# Patient Record
Sex: Female | Born: 1995 | Race: Black or African American | Hispanic: No | Marital: Single | Smoking: Never smoker
Health system: Southern US, Community
[De-identification: ages and names within clinical notes are randomized; demographics above are authoritative.]

## PROBLEM LIST (undated history)

## (undated) DIAGNOSIS — B9689 Other specified bacterial agents as the cause of diseases classified elsewhere: Secondary | ICD-10-CM

## (undated) DIAGNOSIS — N926 Irregular menstruation, unspecified: Principal | ICD-10-CM

## (undated) DIAGNOSIS — R87610 Atypical squamous cells of undetermined significance on cytologic smear of cervix (ASC-US): Principal | ICD-10-CM

## (undated) DIAGNOSIS — Z309 Encounter for contraceptive management, unspecified: Secondary | ICD-10-CM

## (undated) DIAGNOSIS — N898 Other specified noninflammatory disorders of vagina: Principal | ICD-10-CM

## (undated) DIAGNOSIS — M545 Low back pain, unspecified: Secondary | ICD-10-CM

## (undated) DIAGNOSIS — A749 Chlamydial infection, unspecified: Secondary | ICD-10-CM

## (undated) DIAGNOSIS — N9489 Other specified conditions associated with female genital organs and menstrual cycle: Principal | ICD-10-CM

## (undated) DIAGNOSIS — Z113 Encounter for screening for infections with a predominantly sexual mode of transmission: Secondary | ICD-10-CM

## (undated) DIAGNOSIS — D259 Leiomyoma of uterus, unspecified: Secondary | ICD-10-CM

## (undated) DIAGNOSIS — N760 Acute vaginitis: Secondary | ICD-10-CM

## (undated) DIAGNOSIS — Z8619 Personal history of other infectious and parasitic diseases: Principal | ICD-10-CM

## (undated) DIAGNOSIS — J309 Allergic rhinitis, unspecified: Secondary | ICD-10-CM

## (undated) DIAGNOSIS — R87810 Cervical high risk human papillomavirus (HPV) DNA test positive: Principal | ICD-10-CM

## (undated) DIAGNOSIS — N946 Dysmenorrhea, unspecified: Secondary | ICD-10-CM

## (undated) HISTORY — DX: Personal history of other infectious and parasitic diseases: Z86.19

## (undated) HISTORY — DX: Allergic rhinitis, unspecified: J30.9

## (undated) HISTORY — DX: Other specified bacterial agents as the cause of diseases classified elsewhere: B96.89

## (undated) HISTORY — DX: Acute vaginitis: N76.0

## (undated) HISTORY — DX: Other specified noninflammatory disorders of vagina: N89.8

## (undated) HISTORY — DX: Leiomyoma of uterus, unspecified: D25.9

## (undated) HISTORY — DX: Atypical squamous cells of undetermined significance on cytologic smear of cervix (ASC-US): R87.610

## (undated) HISTORY — DX: Cervical high risk human papillomavirus (HPV) DNA test positive: R87.810

## (undated) HISTORY — DX: Other specified conditions associated with female genital organs and menstrual cycle: N94.89

## (undated) HISTORY — DX: Encounter for contraceptive management, unspecified: Z30.9

## (undated) HISTORY — DX: Encounter for screening for infections with a predominantly sexual mode of transmission: Z11.3

## (undated) HISTORY — DX: Dysmenorrhea, unspecified: N94.6

## (undated) HISTORY — DX: Chlamydial infection, unspecified: A74.9

## (undated) HISTORY — DX: Low back pain, unspecified: M54.50

## (undated) HISTORY — DX: Irregular menstruation, unspecified: N92.6

## (undated) HISTORY — DX: Low back pain: M54.5

---
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
## 2014-06-15 ENCOUNTER — Encounter: Payer: Self-pay | Admitting: *Deleted

## 2014-06-21 ENCOUNTER — Ambulatory Visit (INDEPENDENT_AMBULATORY_CARE_PROVIDER_SITE_OTHER): Payer: Medicaid Other | Admitting: Adult Health

## 2014-06-21 ENCOUNTER — Encounter: Payer: Self-pay | Admitting: Adult Health

## 2014-06-21 VITALS — BP 110/70 | Ht 62.5 in | Wt 106.0 lb

## 2014-06-21 DIAGNOSIS — Z3009 Encounter for other general counseling and advice on contraception: Secondary | ICD-10-CM

## 2014-06-21 DIAGNOSIS — Z30011 Encounter for initial prescription of contraceptive pills: Secondary | ICD-10-CM

## 2014-06-21 DIAGNOSIS — Z309 Encounter for contraceptive management, unspecified: Secondary | ICD-10-CM

## 2014-06-21 DIAGNOSIS — N946 Dysmenorrhea, unspecified: Secondary | ICD-10-CM

## 2014-06-21 HISTORY — DX: Dysmenorrhea, unspecified: N94.6

## 2014-06-21 HISTORY — DX: Encounter for contraceptive management, unspecified: Z30.9

## 2014-06-21 MED ORDER — NORETHIN ACE-ETH ESTRAD-FE 1-20 MG-MCG(24) PO CHEW: 1.0000 | CHEWABLE_TABLET | Freq: Every day | ORAL | Status: DC

## 2014-06-21 NOTE — Patient Instructions (Signed)

## 2014-06-21 NOTE — Progress Notes (Signed)
Subjective:     Patient ID: Terry Wallace, female   DOB: Sep 11, 1996, 18 y.o.   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 She started her period in 5th grade ad it is regular but has cramps, can be bad, then takes aleve.She was thinking about depo. She is new to this practice.  Review of Systems See HPI Reviewed past medical,surgical, social and family history. Reviewed medications and allergies.     Objective:   Physical Exam BP 110/70  Ht 5' 2.5" (1.588 m)  Wt 106 lb (48.081 kg)  BMI 19.07 kg/m2  LMP 05/29/2014   Skin warm and dry. Neck: mid line trachea, normal thyroid. Lungs: clear to ausculation bilaterally. Cardiovascular: regular rate and rhythm. Discussed depo and OCs, side effects and benefits, and she now wants to try the pill first.Discussed taking at same time every day and condom use.  Assessment:     Contraceptive management Dysmenorrhea     Plan:     Rx minastrin 1 daily disp 1 pack x 1 year, start with next period Use condoms Review handout on OC use Call prn questions    Return in 1 year  Pap at 19

## 2014-06-24 ENCOUNTER — Telehealth: Payer: Self-pay | Admitting: Adult Health

## 2014-06-24 NOTE — Telephone Encounter (Signed)
Pt started today will start minastrin sunday

## 2014-08-19 ENCOUNTER — Telehealth: Payer: Self-pay | Admitting: Adult Health

## 2014-08-19 NOTE — Telephone Encounter (Signed)
Spoke with pt. Pt states she is on birth control pills and is having frequent bleeding. Pt started bleeding 08/05/14 and again 08/18/14. Advised would need to be seen. Pt is in college. I advised can be seen close to college or can be seen here. Pt wants to be seen here. Call transferred to front desk for appt. [place]

## 2014-09-17 ENCOUNTER — Ambulatory Visit: Payer: Medicaid Other | Admitting: Adult Health

## 2015-01-06 ENCOUNTER — Ambulatory Visit (INDEPENDENT_AMBULATORY_CARE_PROVIDER_SITE_OTHER): Payer: Medicaid Other | Admitting: Adult Health

## 2015-01-06 ENCOUNTER — Encounter: Payer: Self-pay | Admitting: Adult Health

## 2015-01-06 VITALS — BP 130/80 | Ht 62.5 in | Wt 111.5 lb

## 2015-01-06 DIAGNOSIS — Z3202 Encounter for pregnancy test, result negative: Secondary | ICD-10-CM

## 2015-01-06 DIAGNOSIS — N926 Irregular menstruation, unspecified: Secondary | ICD-10-CM

## 2015-01-06 DIAGNOSIS — N946 Dysmenorrhea, unspecified: Secondary | ICD-10-CM

## 2015-01-06 HISTORY — DX: Irregular menstruation, unspecified: N92.6

## 2015-01-06 LAB — POCT URINE PREGNANCY: Preg Test, Ur: NEGATIVE

## 2015-01-06 MED ORDER — IBUPROFEN 800 MG PO TABS: 800.0000 mg | ORAL_TABLET | Freq: Three times a day (TID) | ORAL | Status: DC | PRN

## 2015-01-06 NOTE — Progress Notes (Signed)
Subjective:     Patient ID: Terry Wallace, female   DOB: 16-Sep-1996, 19 y.o.   MRN: [id number]  HPI Terry Wallace is a 19 year old black female in as work in complaining of irregular bleeding and cramps, has missed some pills.Has had sex with condom.  Review of Systems See HPI Reviewed past medical,surgical, social and family history. Reviewed medications and allergies.     Objective:   Physical Exam BP 130/80 mmHg  Ht 5' 2.5" (1.588 m)  Wt 111 lb 8 oz (50.576 kg)  BMI 20.06 kg/m2  LMP 01/28/2016UPT negative, Skin warm and dry.Pelvic: external genitalia is normal in appearance, vagina:period like blood, cervix:smooth and bulbous, uterus: normal size, shape and contour, non tender, no masses felt, adnexa: no masses or tenderness noted.  GC/CHL obtained.     Assessment:     Irregular bleeding Dysmenorrhea      Plan:    GC/CHL sent Continue Minastrin, take daily at same time   Rx motrin 800 mg #60 1 every 8 hours prn cramps with 1 refill Follow up prn

## 2015-01-06 NOTE — Patient Instructions (Signed)
Try motrin for cramps Continue OCs Follow up prn

## 2015-01-09 LAB — GC/CHLAMYDIA PROBE AMP
Chlamydia trachomatis, NAA: NEGATIVE
Neisseria gonorrhoeae by PCR: NEGATIVE

## 2015-04-12 ENCOUNTER — Other Ambulatory Visit: Payer: Self-pay | Admitting: Adult Health

## 2015-05-02 ENCOUNTER — Other Ambulatory Visit: Payer: Self-pay | Admitting: Adult Health

## 2015-05-05 ENCOUNTER — Telehealth: Payer: Self-pay | Admitting: Adult Health

## 2015-05-05 NOTE — Telephone Encounter (Signed)
Has discharge with odor still at college will call in am for appt 6/6

## 2015-05-16 ENCOUNTER — Ambulatory Visit (INDEPENDENT_AMBULATORY_CARE_PROVIDER_SITE_OTHER): Payer: Medicaid Other | Admitting: Adult Health

## 2015-05-16 ENCOUNTER — Encounter: Payer: Self-pay | Admitting: Adult Health

## 2015-05-16 VITALS — BP 112/60 | HR 92 | Ht 62.5 in | Wt 107.0 lb

## 2015-05-16 DIAGNOSIS — A499 Bacterial infection, unspecified: Secondary | ICD-10-CM | POA: Diagnosis not present

## 2015-05-16 DIAGNOSIS — N898 Other specified noninflammatory disorders of vagina: Secondary | ICD-10-CM | POA: Diagnosis not present

## 2015-05-16 DIAGNOSIS — N760 Acute vaginitis: Secondary | ICD-10-CM | POA: Diagnosis not present

## 2015-05-16 DIAGNOSIS — B9689 Other specified bacterial agents as the cause of diseases classified elsewhere: Secondary | ICD-10-CM

## 2015-05-16 HISTORY — DX: Other specified bacterial agents as the cause of diseases classified elsewhere: B96.89

## 2015-05-16 HISTORY — DX: Other specified noninflammatory disorders of vagina: N89.8

## 2015-05-16 LAB — POCT WET PREP (WET MOUNT)
Clue Cells Wet Prep Whiff POC: POSITIVE
WBC, Wet Prep HPF POC: POSITIVE

## 2015-05-16 MED ORDER — METRONIDAZOLE 500 MG PO TABS: 500.0000 mg | ORAL_TABLET | Freq: Two times a day (BID) | ORAL | Status: DC

## 2015-05-16 NOTE — Patient Instructions (Signed)
Bacterial Vaginosis Bacterial vaginosis is a vaginal infection that occurs when the normal balance of bacteria in the vagina is disrupted. It results from an overgrowth of certain bacteria. This is the most common vaginal infection in women of childbearing age. Treatment is important to prevent complications, especially in pregnant women, as it can cause a premature delivery. CAUSES  Bacterial vaginosis is caused by an increase in harmful bacteria that are normally present in smaller amounts in the vagina. Several different kinds of bacteria can cause bacterial vaginosis. However, the reason that the condition develops is not fully understood. RISK FACTORS Certain activities or behaviors can put you at an increased risk of developing bacterial vaginosis, including:  Having a new sex partner or multiple sex partners.  Douching.  Using an intrauterine device (IUD) for contraception. Women do not get bacterial vaginosis from toilet seats, bedding, swimming pools, or contact with objects around them. SIGNS AND SYMPTOMS  Some women with bacterial vaginosis have no signs or symptoms. Common symptoms include:  Grey vaginal discharge.  A fishlike odor with discharge, especially after sexual intercourse.  Itching or burning of the vagina and vulva.  Burning or pain with urination. DIAGNOSIS  Your health care provider will take a medical history and examine the vagina for signs of bacterial vaginosis. A sample of vaginal fluid may be taken. Your health care provider will look at this sample under a microscope to check for bacteria and abnormal cells. A vaginal pH test may also be done.  TREATMENT  Bacterial vaginosis may be treated with antibiotic medicines. These may be given in the form of a pill or a vaginal cream. A second round of antibiotics may be prescribed if the condition comes back after treatment.  HOME CARE INSTRUCTIONS   Only take over-the-counter or prescription medicines as  directed by your health care provider.  If antibiotic medicine was prescribed, take it as directed. Make sure you finish it even if you start to feel better.  Do not have sex until treatment is completed.  Tell all sexual partners that you have a vaginal infection. They should see their health care provider and be treated if they have problems, such as a mild rash or itching.  Practice safe sex by using condoms and only having one sex partner. SEEK MEDICAL CARE IF:   Your symptoms are not improving after 3 days of treatment.  You have increased discharge or pain.  You have a fever. MAKE SURE YOU:   Understand these instructions.  Will watch your condition.  Will get help right away if you are not doing well or get worse. FOR MORE INFORMATION  Centers for Disease Control and Prevention, Division of STD Prevention: AppraiserFraud.fi American Sexual Health Association (ASHA): www.ashastd.org  Document Released: 11/26/2005 Document Revised: 09/16/2013 Document Reviewed: 07/08/2013 [facility] Patient Information 2015 [place], [place]. This information is not intended to replace advice given to you by your health care provider. Make sure you discuss any questions you have with your health care provider. No sex or alcohol with treatment Follow up prn  use condoms

## 2015-05-16 NOTE — Progress Notes (Signed)
Subjective:     Patient ID: Terry Wallace, female   DOB: 1996/11/01, 19 y.o.   MRN: [id number]  HPI Terry Wallace is a 19 year old black female in complaining of vaginal discharge with odor, since February, has no pain or itching or burning.Has not had sex lately, boyfriend in [place].She missed 2 pills and started period, but usually takes on time.She just returned from college in [place] is taking dance.  Review of Systems Patient denies any headaches, hearing loss, fatigue, blurred vision, shortness of breath, chest pain, abdominal pain, problems with bowel movements, urination, or intercourse. No joint pain or mood swings.+vaginal discharge with odor.  Reviewed past medical,surgical, social and family history. Reviewed medications and allergies.     Objective:   Physical Exam BP 112/60 mmHg  Pulse 92  Ht 5' 2.5" (1.588 m)  Wt 107 lb (48.535 kg)  BMI 19.25 kg/m2  LMP 05/12/2015 Skin warm and dry.Pelvic: external genitalia is normal in appearance no lesions, vagina: white discharge with odor,urethra has no lesions or masses noted, cervix:smooth and tiny, uterus: normal size, shape and contour, non tender, no masses felt, adnexa: no masses or tenderness noted. Bladder is non tender and no masses felt. Wet prep: + for clue cells and +WBCs.+whiff.Declines GC/CHL testing, was negative in January.    Assessment:     Vaginal discharge BV     Plan:     Rx flagyl 500 mg 1 bid x 7 days,#14 with 1 refill, no alcohol, review handout on BV   Use condoms Follow up prn

## 2015-05-19 ENCOUNTER — Telehealth: Payer: Self-pay | Admitting: Adult Health

## 2015-05-19 MED ORDER — METRONIDAZOLE 0.75 % VA GEL: 1.0000 | Freq: Every day | VAGINAL | Status: DC

## 2015-05-19 NOTE — Telephone Encounter (Signed)
Had nausea and vomiting with flagyl will stop and rx metrogel

## 2015-10-31 ENCOUNTER — Telehealth: Payer: Self-pay | Admitting: *Deleted

## 2015-10-31 NOTE — Telephone Encounter (Signed)
Pt c/o heavy vaginal bleeding x 2 weeks. Pt takes Minastrin for birth control. Pt requesting med to stop bleeding.

## 2015-10-31 NOTE — Telephone Encounter (Signed)
She stopped by office to make appt

## 2015-11-02 ENCOUNTER — Ambulatory Visit: Payer: Medicaid Other | Admitting: Adult Health

## 2015-11-08 ENCOUNTER — Other Ambulatory Visit: Payer: Self-pay | Admitting: Adult Health

## 2015-12-29 ENCOUNTER — Ambulatory Visit: Payer: Medicaid Other | Admitting: Adult Health

## 2015-12-29 ENCOUNTER — Encounter: Payer: Self-pay | Admitting: Adult Health

## 2016-01-13 ENCOUNTER — Ambulatory Visit (INDEPENDENT_AMBULATORY_CARE_PROVIDER_SITE_OTHER): Payer: Medicaid Other | Admitting: Adult Health

## 2016-01-13 ENCOUNTER — Encounter: Payer: Self-pay | Admitting: Adult Health

## 2016-01-13 VITALS — BP 138/80 | HR 84 | Ht 62.5 in | Wt 113.0 lb

## 2016-01-13 DIAGNOSIS — N760 Acute vaginitis: Secondary | ICD-10-CM

## 2016-01-13 DIAGNOSIS — N898 Other specified noninflammatory disorders of vagina: Secondary | ICD-10-CM

## 2016-01-13 DIAGNOSIS — A499 Bacterial infection, unspecified: Secondary | ICD-10-CM | POA: Diagnosis not present

## 2016-01-13 DIAGNOSIS — Z113 Encounter for screening for infections with a predominantly sexual mode of transmission: Secondary | ICD-10-CM

## 2016-01-13 DIAGNOSIS — B9689 Other specified bacterial agents as the cause of diseases classified elsewhere: Secondary | ICD-10-CM

## 2016-01-13 DIAGNOSIS — Z3009 Encounter for other general counseling and advice on contraception: Secondary | ICD-10-CM | POA: Insufficient documentation

## 2016-01-13 HISTORY — DX: Encounter for screening for infections with a predominantly sexual mode of transmission: Z11.3

## 2016-01-13 LAB — POCT WET PREP (WET MOUNT): CLUE CELLS WET PREP WHIFF POC: POSITIVE

## 2016-01-13 MED ORDER — METRONIDAZOLE 0.75 % VA GEL: 1.0000 | Freq: Every day | VAGINAL | Status: DC

## 2016-01-13 NOTE — Progress Notes (Signed)
Subjective:     Patient ID: [name], female   DOB: 02/17/1996, 20 y.o.   MRN: [id number]  HPI Shasha is a 20 year old black female in for STD screening, no complaints.  Review of Systems Patient denies any headaches, hearing loss, fatigue, blurred vision, shortness of breath, chest pain, abdominal pain, problems with bowel movements, urination, or intercourse. No joint pain or mood swings. Reviewed past medical,surgical, social and family history. Reviewed medications and allergies.     Objective:   Physical Exam BP 138/80 mmHg  Pulse 84  Ht 5' 2.5" (1.588 m)  Wt 113 lb (51.256 kg)  BMI 20.33 kg/m2 Skin warm and dry.Pelvic: external genitalia is normal in appearance no lesions, vagina: white discharge with odor,urethra has no lesions or masses noted, cervix:smooth and bulbous, uterus: normal size, shape and contour, non tender, no masses felt, adnexa: no masses or tenderness noted. Bladder is non tender and no masses felt. Wet prep: + for clue cells and +WBCs. Abdomen soft and non tender, no HSM. Encouraged to use condoms.    Assessment:     Vaginal discharge BV STD screening    Plan:    GC/CHL sent on urine Check HIV,RPR and HSV 2 Rx metrogel 1 applicator in vagina has x 5 days Review handout on BV Follow up prn

## 2016-01-13 NOTE — Patient Instructions (Signed)
Bacterial Vaginosis Bacterial vaginosis is a vaginal infection that occurs when the normal balance of bacteria in the vagina is disrupted. It results from an overgrowth of certain bacteria. This is the most common vaginal infection in women of childbearing age. Treatment is important to prevent complications, especially in pregnant women, as it can cause a premature delivery. CAUSES  Bacterial vaginosis is caused by an increase in harmful bacteria that are normally present in smaller amounts in the vagina. Several different kinds of bacteria can cause bacterial vaginosis. However, the reason that the condition develops is not fully understood. RISK FACTORS Certain activities or behaviors can put you at an increased risk of developing bacterial vaginosis, including:  Having a new sex partner or multiple sex partners.  Douching.  Using an intrauterine device (IUD) for contraception. Women do not get bacterial vaginosis from toilet seats, bedding, swimming pools, or contact with objects around them. SIGNS AND SYMPTOMS  Some women with bacterial vaginosis have no signs or symptoms. Common symptoms include:  Grey vaginal discharge.  A fishlike odor with discharge, especially after sexual intercourse.  Itching or burning of the vagina and vulva.  Burning or pain with urination. DIAGNOSIS  Your health care provider will take a medical history and examine the vagina for signs of bacterial vaginosis. A sample of vaginal fluid may be taken. Your health care provider will look at this sample under a microscope to check for bacteria and abnormal cells. A vaginal pH test may also be done.  TREATMENT  Bacterial vaginosis may be treated with antibiotic medicines. These may be given in the form of a pill or a vaginal cream. A second round of antibiotics may be prescribed if the condition comes back after treatment. Because bacterial vaginosis increases your risk for sexually transmitted diseases, getting  treated can help reduce your risk for chlamydia, gonorrhea, HIV, and herpes. HOME CARE INSTRUCTIONS   Only take over-the-counter or prescription medicines as directed by your health care provider.  If antibiotic medicine was prescribed, take it as directed. Make sure you finish it even if you start to feel better.  Tell all sexual partners that you have a vaginal infection. They should see their health care provider and be treated if they have problems, such as a mild rash or itching.  During treatment, it is important that you follow these instructions:  Avoid sexual activity or use condoms correctly.  Do not douche.  Avoid alcohol as directed by your health care provider.  Avoid breastfeeding as directed by your health care provider. SEEK MEDICAL CARE IF:   Your symptoms are not improving after 3 days of treatment.  You have increased discharge or pain.  You have a fever. MAKE SURE YOU:   Understand these instructions.  Will watch your condition.  Will get help right away if you are not doing well or get worse. FOR MORE INFORMATION  Centers for Disease Control and Prevention, Division of STD Prevention: AppraiserFraud.fi American Sexual Health Association (ASHA): www.ashastd.org    This information is not intended to replace advice given to you by your health care provider. Make sure you discuss any questions you have with your health care provider.   Document Released: 11/26/2005 Document Revised: 12/17/2014 Document Reviewed: 07/08/2013 Elsevier Interactive Patient Education 2016 [employer]. Use metrogel No alcohol or sex

## 2016-01-14 LAB — RPR: RPR Ser Ql: NONREACTIVE

## 2016-01-14 LAB — HSV 2 ANTIBODY, IGG: HSV 2 Glycoprotein G Ab, IgG: <0.91 index (ref 0.00–0.90)

## 2016-01-14 LAB — HIV ANTIBODY (ROUTINE TESTING W REFLEX): HIV SCREEN 4TH GENERATION: NONREACTIVE

## 2016-01-17 LAB — GC/CHLAMYDIA PROBE AMP
Chlamydia trachomatis, NAA: POSITIVE — AB
NEISSERIA GONORRHOEAE BY PCR: NEGATIVE

## 2016-01-18 ENCOUNTER — Telehealth: Payer: Self-pay | Admitting: Adult Health

## 2016-01-18 MED ORDER — AZITHROMYCIN 500 MG PO TABS: ORAL_TABLET | ORAL | Status: DC

## 2016-01-18 NOTE — Telephone Encounter (Signed)
Pt aware labs negative but +CHlamydia, will treat with azithromycin 500 mg #2 take 2 po now and tell partners to go to clinic, no sex and POT 02/16/16 and [facility] sent

## 2016-01-19 ENCOUNTER — Telehealth: Payer: Self-pay | Admitting: Adult Health

## 2016-01-19 ENCOUNTER — Telehealth: Payer: Self-pay | Admitting: *Deleted

## 2016-01-19 NOTE — Telephone Encounter (Signed)
Left message x 1. JSY 

## 2016-01-19 NOTE — Telephone Encounter (Signed)
Vomited about 1 hour after azithromycin, should be fine

## 2016-01-20 NOTE — Telephone Encounter (Signed)
Left message x 2. JSY 

## 2016-01-23 NOTE — Telephone Encounter (Signed)
Left message x 3. JSY 

## 2016-01-24 NOTE — Telephone Encounter (Signed)
3 messages left for pt to return call. Never heard from pt. Encounter closed. JSY 

## 2016-01-26 ENCOUNTER — Telehealth: Payer: Self-pay | Admitting: Adult Health

## 2016-01-26 NOTE — Telephone Encounter (Signed)
She calls and wants partner, Guadlupe Spanish, DOB 06/05/[birth year], Rx azoithromycin 500 mg #2 take 2 po now to [facility] in [place]

## 2016-02-01 ENCOUNTER — Telehealth: Payer: Self-pay | Admitting: Adult Health

## 2016-02-01 NOTE — Telephone Encounter (Signed)
Spoke with pt. Pt was + for chlamydia and wanted med for partner. I looked at JAG's note and she advised partners to go to clinic for treatment. Pt voiced understanding. [place]

## 2016-02-16 ENCOUNTER — Ambulatory Visit (INDEPENDENT_AMBULATORY_CARE_PROVIDER_SITE_OTHER): Payer: Medicaid Other | Admitting: Adult Health

## 2016-02-16 ENCOUNTER — Encounter: Payer: Self-pay | Admitting: Adult Health

## 2016-02-16 VITALS — BP 122/70 | HR 86 | Ht 62.5 in | Wt 111.5 lb

## 2016-02-16 DIAGNOSIS — Z8619 Personal history of other infectious and parasitic diseases: Secondary | ICD-10-CM

## 2016-02-16 HISTORY — DX: Personal history of other infectious and parasitic diseases: Z86.19

## 2016-02-16 NOTE — Patient Instructions (Signed)
Use condoms Follow up prn 

## 2016-02-16 NOTE — Progress Notes (Signed)
Subjective:     Patient ID: Terry Wallace, female   DOB: Sep 05, 1996, 20 y.o.   MRN: [id number]  HPI Terry Wallace is a 20 year old black female in for proof of cure or recent + Chlamydia,she has not had sex, but she did vomit about an hour after taking meds.No complaints today.  Review of Systems Patient denies any headaches, hearing loss, fatigue, blurred vision, shortness of breath, chest pain, abdominal pain, problems with bowel movements, urination, or intercourse. No joint pain or mood swings.For proof of cure for recent Chlamydia  Reviewed past medical,surgical, social and family history. Reviewed medications and allergies.     Objective:   Physical Exam BP 122/70 mmHg  Pulse 86  Ht 5' 2.5" (1.588 m)  Wt 111 lb 8 oz (50.576 kg)  BMI 20.06 kg/m2   Skin warm and dry.  Lungs: clear to ausculation bilaterally. Cardiovascular: regular rate and rhythm.Abdomen soft and non tender. Assessment:     History of chlamydia, for proof of cure    Plan:    Use condoms GC/CHL sent on urine Follow up prn

## 2016-02-18 LAB — GC/CHLAMYDIA PROBE AMP
CHLAMYDIA, DNA PROBE: NEGATIVE
Neisseria gonorrhoeae by PCR: NEGATIVE

## 2016-03-06 ENCOUNTER — Other Ambulatory Visit: Payer: Self-pay | Admitting: Adult Health

## 2016-04-20 ENCOUNTER — Telehealth: Payer: Self-pay | Admitting: Obstetrics and Gynecology

## 2016-04-20 NOTE — Telephone Encounter (Signed)
Pt states she missed her OCP x 1, usually doesn't have a period with OCP, now having vaginal bleeding with clots. Pt states stopped taking OCP when she started the vaginal bleeding. Per Derrek Monaco, NP, needs to make an appt for pregnancy test, no sex until after her appt. Pt verbalized understanding.

## 2016-04-23 ENCOUNTER — Ambulatory Visit (INDEPENDENT_AMBULATORY_CARE_PROVIDER_SITE_OTHER): Payer: Medicaid Other | Admitting: Adult Health

## 2016-04-23 ENCOUNTER — Encounter: Payer: Self-pay | Admitting: Adult Health

## 2016-04-23 VITALS — BP 128/78 | HR 82 | Ht 62.5 in | Wt 109.0 lb

## 2016-04-23 DIAGNOSIS — Z3202 Encounter for pregnancy test, result negative: Secondary | ICD-10-CM | POA: Diagnosis not present

## 2016-04-23 DIAGNOSIS — Z30011 Encounter for initial prescription of contraceptive pills: Secondary | ICD-10-CM | POA: Diagnosis not present

## 2016-04-23 DIAGNOSIS — Z3041 Encounter for surveillance of contraceptive pills: Secondary | ICD-10-CM

## 2016-04-23 LAB — POCT URINE PREGNANCY: PREG TEST UR: NEGATIVE

## 2016-04-23 MED ORDER — NORETHIN ACE-ETH ESTRAD-FE 1-20 MG-MCG(24) PO CHEW: CHEWABLE_TABLET | ORAL | Status: DC

## 2016-04-23 NOTE — Progress Notes (Signed)
Subjective:     Patient ID: [name], female   DOB: [birth year], 20 y.o.   MRN: [id number]  HPI Eliannah is a 20 year old black female in for UPT, she missed a pill then just stopped taking them, has not had sex since then.  Review of Systems Patient denies any headaches, hearing loss, fatigue, blurred vision, shortness of breath, chest pain, abdominal pain, problems with bowel movements, urination, or intercourse. No joint pain or mood swings. Reviewed past medical,surgical, social and family history. Reviewed medications and allergies.     Objective:   Physical Exam BP 128/78 mmHg  Pulse 82  Ht 5' 2.5" (1.588 m)  Wt 109 lb (49.442 kg)  BMI 19.61 kg/m2  LMP 04/19/2016 UPT negative, Skin warm and dry, no rashes. Lungs: clear to ausculation bilaterally. Cardiovascular: regular rate and rhythm.   Discussed take missed pill when realizes it, and never stop the pills and use condoms. Assessment:     Contraceptive management     Plan:     Refilled minastrin x 6 Use condoms  Return in 6 months for pap and physical

## 2016-04-23 NOTE — Patient Instructions (Signed)
Start minastrin today Use condoms Return in 6 months for pap and physical

## 2016-07-03 ENCOUNTER — Telehealth: Payer: Self-pay | Admitting: Adult Health

## 2016-07-03 NOTE — Telephone Encounter (Signed)
Pt stopped by the office and wanted to check and see if the BCP that she was given from the pharmacy was the same as what she had been given. I showed the medication to [facility] and she advised that it was the same.

## 2016-07-11 ENCOUNTER — Encounter: Payer: Self-pay | Admitting: Adult Health

## 2016-07-11 ENCOUNTER — Ambulatory Visit (INDEPENDENT_AMBULATORY_CARE_PROVIDER_SITE_OTHER): Payer: Medicaid Other | Admitting: Adult Health

## 2016-07-11 DIAGNOSIS — N9489 Other specified conditions associated with female genital organs and menstrual cycle: Secondary | ICD-10-CM | POA: Diagnosis not present

## 2016-07-11 HISTORY — DX: Other specified conditions associated with female genital organs and menstrual cycle: N94.89

## 2016-07-11 LAB — POCT WET PREP (WET MOUNT): Clue Cells Wet Prep Whiff POC: NEGATIVE

## 2016-07-11 MED ORDER — NYSTATIN-TRIAMCINOLONE 100000-0.1 UNIT/GM-% EX CREA: 1.0000 "application " | TOPICAL_CREAM | Freq: Two times a day (BID) | CUTANEOUS | 0 refills | Status: DC

## 2016-07-11 NOTE — Patient Instructions (Addendum)
Use cream to labia bid Follow up prn

## 2016-07-11 NOTE — Progress Notes (Signed)
Subjective:     Patient ID: Terry Wallace, female   DOB: 04/21/1996, 20 y.o.   MRN: [id number]  HPI Terry Wallace is a 20 year old black female, in complaining of swelling of right labia today, no itching or burning or pain.May have used new soap.  Review of Systems + swelling right labia Reviewed past medical,surgical, social and family history. Reviewed medications and allergies.     Objective:   Physical Exam BP 114/60 (BP Location: Left Arm, Patient Position: Sitting, Cuff Size: Normal)   Pulse (!) 102   Ht 5' 2.5" (1.588 m)   Wt 108 lb 8 oz (49.2 kg)   LMP 06/25/2016 (Approximate)   BMI 19.53 kg/m  Skin warm and dry.Pelvic: external genitalia is normal in appearance no lesions,except right labia is slightly swollen, non tender vagina: scant pink without odor,urethra has no lesions or masses noted, cervix:smooth and, uterus: normal size, shape and contour, non tender, no masses felt, adnexa: no masses or tenderness noted. Bladder is non tender and no masses felt. Wet prep: few +WBCs.     Assessment:     Swelling of labia    Plan:     Rx mycolog cream use bid  Follow up prn

## 2016-07-25 ENCOUNTER — Encounter: Payer: Self-pay | Admitting: Adult Health

## 2016-07-25 ENCOUNTER — Ambulatory Visit (INDEPENDENT_AMBULATORY_CARE_PROVIDER_SITE_OTHER): Payer: Medicaid Other | Admitting: Adult Health

## 2016-07-25 VITALS — BP 114/62 | HR 90 | Ht 62.5 in | Wt 107.5 lb

## 2016-07-25 DIAGNOSIS — Z3041 Encounter for surveillance of contraceptive pills: Secondary | ICD-10-CM | POA: Diagnosis not present

## 2016-07-25 DIAGNOSIS — Z3202 Encounter for pregnancy test, result negative: Secondary | ICD-10-CM

## 2016-07-25 DIAGNOSIS — N926 Irregular menstruation, unspecified: Secondary | ICD-10-CM

## 2016-07-25 LAB — POCT URINE PREGNANCY: Preg Test, Ur: NEGATIVE

## 2016-07-25 MED ORDER — NORETHIN ACE-ETH ESTRAD-FE 1-20 MG-MCG(24) PO CAPS: 1.0000 | ORAL_CAPSULE | Freq: Every day | ORAL | 3 refills | Status: DC

## 2016-07-25 NOTE — Progress Notes (Signed)
Subjective:     Patient ID: Terry Wallace, female   DOB: June 07, 1996, 20 y.o.   MRN: [id number]  HPI Terry Wallace is a 20 year old black female in complaining of bleeding on and off x 1 month, has missed a pill and was given generic this time for her OCs.   Review of Systems +bleeding for a month on and off after missed pill,no pain Reviewed past medical,surgical, social and family history. Reviewed medications and allergies.     Objective:   Physical Exam BP 114/62 (BP Location: Left Arm, Patient Position: Sitting, Cuff Size: Normal)   Pulse 90   Ht 5' 2.5" (1.588 m)   Wt 107 lb 8 oz (48.8 kg)   LMP 06/25/2016 (Approximate) Comment: ongoing bleeding  BMI 19.35 kg/m  UPT negative, Skin warm and dry.Pelvic: external genitalia is normal in appearance no lesions, vagina: period like blood,urethra has no lesions or masses noted, cervix:smooth and tiny, uterus: normal size, shape and contour, non tender, no masses felt, adnexa: no masses or tenderness noted. Bladder is non tender and no masses felt.  GC/CHL obtained.     Assessment:     Irregular bleeding - Plan: GC/Chlamydia Probe Amp  Pregnancy examination or test, negative result - Plan: POCT urine pregnancy  Encounter for surveillance of contraceptive pills     Plan:    GC/CHL sent  Finish current pill pack then start taytulla Use condoms Rx taytulla disp #84 take 1 daily with 3 refills  Follow up as scheduled

## 2016-07-25 NOTE — Patient Instructions (Signed)
Finish this pack then start taytulla Use condoms Follow up as scheduled

## 2016-07-27 ENCOUNTER — Telehealth: Payer: Self-pay | Admitting: Adult Health

## 2016-07-27 LAB — GC/CHLAMYDIA PROBE AMP
CHLAMYDIA, DNA PROBE: POSITIVE — AB
NEISSERIA GONORRHOEAE BY PCR: NEGATIVE

## 2016-07-27 MED ORDER — AZITHROMYCIN 500 MG PO TABS: ORAL_TABLET | ORAL | 0 refills | Status: DC

## 2016-07-27 NOTE — Telephone Encounter (Signed)
Left message to call about cultures

## 2016-07-27 NOTE — Telephone Encounter (Signed)
Pt aware +Chlamydia, will rx azithromycin 500 mg #2 2 po now and No sex for 4 weeks, return 9/15 at 9 am for POT and [place] sent,she says she will tell partner to go to clinic

## 2016-08-15 ENCOUNTER — Encounter: Payer: Self-pay | Admitting: Adult Health

## 2016-08-15 ENCOUNTER — Ambulatory Visit (INDEPENDENT_AMBULATORY_CARE_PROVIDER_SITE_OTHER): Payer: Medicaid Other | Admitting: Adult Health

## 2016-08-15 VITALS — BP 108/70 | HR 90 | Ht 62.5 in | Wt 107.0 lb

## 2016-08-15 DIAGNOSIS — Z8619 Personal history of other infectious and parasitic diseases: Secondary | ICD-10-CM | POA: Diagnosis not present

## 2016-08-15 DIAGNOSIS — N926 Irregular menstruation, unspecified: Secondary | ICD-10-CM | POA: Diagnosis not present

## 2016-08-15 MED ORDER — NORETHIN-ETH ESTRAD-FE BIPHAS 1 MG-10 MCG / 10 MCG PO TABS: 1.0000 | ORAL_TABLET | Freq: Every day | ORAL | 0 refills | Status: DC

## 2016-08-15 NOTE — Patient Instructions (Signed)
Finish current OCs then start lo loestrin use condoms Follow up in 2 months

## 2016-08-15 NOTE — Progress Notes (Signed)
Subjective:     Patient ID: Terry Wallace, female   DOB: Oct 18, 1996, 20 y.o.   MRN: [id number]  HPI [name] is a 20 year old black female in complaining of still bleeding some and cramps, she was recently treated for + chlamydia and has not had sex since.  Review of Systems +irregular bleeding and cramps at times Reviewed past medical,surgical, social and family history. Reviewed medications and allergies.     Objective:   Physical Exam BP 108/70 (BP Location: Left Arm, Patient Position: Sitting, Cuff Size: Normal)   Pulse 90   Ht 5' 2.5" (1.588 m)   Wt 107 lb (48.5 kg)   LMP 06/25/2016 (Approximate) Comment: ongoing bleeding  BMI 19.26 kg/m  Skin warm and dry.Pelvic: external genitalia is normal in appearance no lesions, vagina: lite period like blood,urethra has no lesions or masses noted, cervix:smooth, uterus: normal size, shape and contour, non tender, no masses felt, adnexa: no masses or tenderness noted. Bladder is non tender and no masses felt.  GC/CHL obtained.     Assessment:     Irregular bleeding - Plan: GC/Chlamydia Probe Amp  History of chlamydia - Plan: GC/Chlamydia Probe Amp     Plan:     GC/CHL sent Given 2 packs of lo loestrin to try, start after finishing this pack of mibelas, use condoms (lot [id number],exp 3/19) Follow up in 2 months

## 2016-08-17 ENCOUNTER — Telehealth: Payer: Self-pay | Admitting: Adult Health

## 2016-08-17 LAB — GC/CHLAMYDIA PROBE AMP
CHLAMYDIA, DNA PROBE: NEGATIVE
Neisseria gonorrhoeae by PCR: NEGATIVE

## 2016-08-17 NOTE — Telephone Encounter (Signed)
Pt aware CG/CHL negative do not have to come next Friday

## 2016-08-24 ENCOUNTER — Ambulatory Visit: Payer: Medicaid Other | Admitting: Adult Health

## 2016-10-01 ENCOUNTER — Telehealth: Payer: Self-pay | Admitting: *Deleted

## 2016-10-01 NOTE — Telephone Encounter (Signed)
Spoke with pt letting her know she should have prescription for [name] at [facility]. Pt to call pharmacy and make sure. Will call us back if any problems. Pt voiced understanding. [place]

## 2016-10-08 ENCOUNTER — Telehealth: Payer: Self-pay | Admitting: Adult Health

## 2016-10-08 MED ORDER — NORETHIN ACE-ETH ESTRAD-FE 1-20 MG-MCG(24) PO CHEW: CHEWABLE_TABLET | ORAL | 12 refills | Status: DC

## 2016-10-08 NOTE — Telephone Encounter (Signed)
Pt called stating that [facility] sent a prescription of [name] to [facility]. The pharmacy state that they do not carry that medication. Pt would like to know what BC can she take that is equivalent to that [facility]. Please contact pt

## 2016-10-08 NOTE — Telephone Encounter (Signed)
Left message that there is prescription waiting for her

## 2016-10-09 ENCOUNTER — Telehealth: Payer: Self-pay | Admitting: Adult Health

## 2016-10-09 NOTE — Telephone Encounter (Signed)
Spoke with pt. Pt thinks she has been on the [place] before and that caused bleeding. Please advise. Thanks! [place]

## 2016-10-10 ENCOUNTER — Encounter: Payer: Self-pay | Admitting: Adult Health

## 2016-10-10 ENCOUNTER — Other Ambulatory Visit (HOSPITAL_COMMUNITY)
Admission: RE | Admit: 2016-10-10 | Discharge: 2016-10-10 | Disposition: A | Discharge status: Home / Self Care | Payer: Medicaid Other | Source: Ambulatory Visit | Attending: Adult Health | Admitting: Adult Health

## 2016-10-10 ENCOUNTER — Ambulatory Visit (INDEPENDENT_AMBULATORY_CARE_PROVIDER_SITE_OTHER): Payer: Medicaid Other | Admitting: Adult Health

## 2016-10-10 VITALS — BP 102/62 | HR 86 | Ht 62.5 in | Wt 108.0 lb

## 2016-10-10 DIAGNOSIS — Z113 Encounter for screening for infections with a predominantly sexual mode of transmission: Secondary | ICD-10-CM | POA: Insufficient documentation

## 2016-10-10 DIAGNOSIS — Z3041 Encounter for surveillance of contraceptive pills: Secondary | ICD-10-CM

## 2016-10-10 DIAGNOSIS — Z01419 Encounter for gynecological examination (general) (routine) without abnormal findings: Secondary | ICD-10-CM

## 2016-10-10 DIAGNOSIS — Z3009 Encounter for other general counseling and advice on contraception: Secondary | ICD-10-CM

## 2016-10-10 DIAGNOSIS — N898 Other specified noninflammatory disorders of vagina: Secondary | ICD-10-CM

## 2016-10-10 DIAGNOSIS — Z1151 Encounter for screening for human papillomavirus (HPV): Secondary | ICD-10-CM | POA: Insufficient documentation

## 2016-10-10 DIAGNOSIS — Z01411 Encounter for gynecological examination (general) (routine) with abnormal findings: Secondary | ICD-10-CM | POA: Insufficient documentation

## 2016-10-10 DIAGNOSIS — Z308 Encounter for other contraceptive management: Secondary | ICD-10-CM

## 2016-10-10 LAB — POCT WET PREP (WET MOUNT)
CLUE CELLS WET PREP WHIFF POC: NEGATIVE
WBC, Wet Prep HPF POC: POSITIVE

## 2016-10-10 MED ORDER — NORETHIN ACE-ETH ESTRAD-FE 1-20 MG-MCG(24) PO CAPS: 1.0000 | ORAL_CAPSULE | Freq: Every day | ORAL | 3 refills | Status: DC

## 2016-10-10 NOTE — Patient Instructions (Signed)
Use condoms Physical in 1 year

## 2016-10-10 NOTE — Telephone Encounter (Signed)
I gave pt 3 sample boxes of [name]. Lot # [id number] exp 09/2017. [place]

## 2016-10-10 NOTE — Telephone Encounter (Signed)
Left message x 1. JSY 

## 2016-10-10 NOTE — Progress Notes (Signed)
Patient ID: KARIM HAUFLER, female   DOB: 1996/08/07, 20 y.o.   MRN: [id number] History of Present Illness:  Briselda is a 20 year old black female in for well woman gyn exam and pap, has family planning medicaid, and she was given generic for taytulla and she bleeds, she wants name brand.   Current Medications, Allergies, Past Medical History, Past Surgical History, Family History and Social History were reviewed in [employer] record.     Review of Systems: Patient denies any headaches, hearing loss, fatigue, blurred vision, shortness of breath, chest pain, abdominal pain, problems with bowel movements, urination, or intercourse. No joint pain or mood swings.+vaginal odor and discharge    Physical Exam:BP 102/62 (BP Location: Left Arm, Patient Position: Sitting, Cuff Size: Normal)   Pulse 86   Ht 5' 2.5" (1.588 m)   Wt 108 lb (49 kg)   BMI 19.44 kg/m  General:  Well developed, well nourished, no acute distress Skin:  Warm and dry Neck:  Midline trachea, normal thyroid, good ROM, no lymphadenopathy Lungs; Clear to auscultation bilaterally Breast:  No dominant palpable mass, retraction, or nipple discharge Cardiovascular: Regular rate and rhythm Abdomen:  Soft, non tender, no hepatosplenomegaly Pelvic:  External genitalia is normal in appearance, no lesions.  The vagina is normal in appearance,with period like blood. Urethra has no lesions or masses. The cervix is smooth,pa with GC/CHL performed.  Uterus is felt to be normal size, shape, and contour.  No adnexal masses or tenderness noted.Bladder is non tender, no masses felt. Wet prep: +WBC and RBCs Extremities/musculoskeletal:  No swelling or varicosities noted, no clubbing or cyanosis Psych:  No mood changes, alert and cooperative,seems happy PHQ 2 score 0  Impression: 1. Encounter for routine gynecological examination with Papanicolaou smear of cervix   2. Family planning   3. Encounter for surveillance of  contraceptive pills   4. Vaginal discharge   5.      Vaginal Odor    Plan: Meds ordered this encounter  Medications  . DISCONTD: Norethin Ace-Eth Estrad-FE ([name] PO)    Sig: Take by mouth daily.  . Norethin Ace-Eth Estrad-FE ([name]) 1-20 MG-MCG(24) CAPS    Sig: Take 1 tablet by mouth daily.    Dispense:  84 capsule    Refill:  3    Order Specific Question:   Supervising Provider    Answer:   Tania Ade H [[id number]]  Check HIV and RPR Use condoms Physical in 1 year

## 2016-10-12 LAB — HIV ANTIBODY (ROUTINE TESTING W REFLEX): HIV Screen 4th Generation wRfx: NONREACTIVE

## 2016-10-12 LAB — RPR: RPR: NONREACTIVE

## 2016-10-15 ENCOUNTER — Ambulatory Visit: Payer: Medicaid Other | Admitting: Adult Health

## 2016-10-15 LAB — CYTOLOGY - PAP
Chlamydia: NEGATIVE
Diagnosis: UNDETERMINED — AB
HPV: DETECTED — AB
Neisseria Gonorrhea: NEGATIVE

## 2016-10-17 ENCOUNTER — Telehealth: Payer: Self-pay | Admitting: Adult Health

## 2016-10-17 ENCOUNTER — Encounter: Payer: Self-pay | Admitting: Adult Health

## 2016-10-17 DIAGNOSIS — R87610 Atypical squamous cells of undetermined significance on cytologic smear of cervix (ASC-US): Secondary | ICD-10-CM

## 2016-10-17 DIAGNOSIS — R87810 Cervical high risk human papillomavirus (HPV) DNA test positive: Principal | ICD-10-CM

## 2016-10-17 HISTORY — DX: Atypical squamous cells of undetermined significance on cytologic smear of cervix (ASC-US): R87.610

## 2016-10-17 NOTE — Telephone Encounter (Signed)
Left message that pap was abnormal with +HPV, GC/CHL was negative,needs colpo appt to look at cervix,make appt with Dr Elonda Husky and call me back, so I know you got my message and I will put on my chart too.

## 2016-10-25 ENCOUNTER — Encounter: Payer: Self-pay | Admitting: Obstetrics & Gynecology

## 2016-10-25 ENCOUNTER — Ambulatory Visit (INDEPENDENT_AMBULATORY_CARE_PROVIDER_SITE_OTHER): Payer: Self-pay | Admitting: Obstetrics & Gynecology

## 2016-10-25 VITALS — BP 110/80 | HR 80 | Ht 62.2 in | Wt 109.0 lb

## 2016-10-25 DIAGNOSIS — Z3202 Encounter for pregnancy test, result negative: Secondary | ICD-10-CM

## 2016-10-25 DIAGNOSIS — R87610 Atypical squamous cells of undetermined significance on cytologic smear of cervix (ASC-US): Secondary | ICD-10-CM

## 2016-10-25 DIAGNOSIS — R87810 Cervical high risk human papillomavirus (HPV) DNA test positive: Secondary | ICD-10-CM

## 2016-10-25 LAB — POCT URINE PREGNANCY: Preg Test, Ur: NEGATIVE

## 2016-10-25 NOTE — Progress Notes (Signed)
Colposcopy Procedure Note:  Colposcopy Procedure Note  Indications: Pap smear 1 months ago showed: ASCUS with POSITIVE high risk HPV. The prior pap showed na.  Prior cervical/vaginal disease: . Prior cervical treatment: .  Smoker:  No. New sexual partner:  No.  : time frame:    History of abnormal Pap: no  Procedure Details  The risks and benefits of the procedure and Written informed consent obtained.  Speculum placed in vagina and excellent visualization of cervix achieved, cervix swabbed x 3 with acetic acid solution.  Findings: Cervix: HPV changes noted at entire [place] . Vaginal inspection: normal without visible lesions. Vulvar colposcopy: vulvar colposcopy not performed.  Specimens: none  Complications: none.  Plan: Will need repeat Pap in 1 year

## 2017-02-21 ENCOUNTER — Encounter: Payer: Self-pay | Admitting: Adult Health

## 2017-02-21 ENCOUNTER — Ambulatory Visit (INDEPENDENT_AMBULATORY_CARE_PROVIDER_SITE_OTHER): Payer: Self-pay | Admitting: Adult Health

## 2017-02-21 VITALS — BP 110/68 | HR 80 | Ht 62.5 in | Wt 112.0 lb

## 2017-02-21 DIAGNOSIS — R3915 Urgency of urination: Secondary | ICD-10-CM

## 2017-02-21 DIAGNOSIS — R350 Frequency of micturition: Secondary | ICD-10-CM

## 2017-02-21 DIAGNOSIS — N898 Other specified noninflammatory disorders of vagina: Secondary | ICD-10-CM

## 2017-02-21 DIAGNOSIS — R319 Hematuria, unspecified: Secondary | ICD-10-CM

## 2017-02-21 LAB — POCT WET PREP (WET MOUNT)
Clue Cells Wet Prep Whiff POC: NEGATIVE
WBC WET PREP: POSITIVE

## 2017-02-21 LAB — POCT URINALYSIS DIPSTICK
Glucose, UA: NEGATIVE
Ketones, UA: NEGATIVE
NITRITE UA: NEGATIVE

## 2017-02-21 MED ORDER — NITROFURANTOIN MONOHYD MACRO 100 MG PO CAPS: 100.0000 mg | ORAL_CAPSULE | Freq: Two times a day (BID) | ORAL | 0 refills | Status: DC

## 2017-02-21 NOTE — Progress Notes (Signed)
Subjective:     Patient ID: Terry Wallace, female   DOB: [birth year], 21 y.o.   MRN: [id number]  HPI [name] is a 21 year old black female in complaining of urinary urgency and frequency for about 2 months now and has vaginal odor, no discharge, or new sex partners. Has some itching, no burning.  Review of Systems Urinary urgency and frequency Vaginal odor, no discharge Some itching,no burning Reviewed past medical,surgical, social and family history. Reviewed medications and allergies.     Objective:   Physical Exam BP 110/68 (BP Location: Right Arm, Patient Position: Sitting, Cuff Size: Normal)   Pulse 80   Ht 5' 2.5" (1.588 m)   Wt 112 lb (50.8 kg)   BMI 20.16 kg/m urine trace blood,1+protein, 1+leuks. Skin warm and dry.Pelvic: external genitalia is normal in appearance no lesions, vagina: white discharge without odor,urethra has no lesions or masses noted, cervix:smooth, uterus: normal size, shape and contour, non tender, no masses felt, adnexa: no masses or tenderness noted. Bladder is non tender and no masses felt. Wet prep: +WBCs.   She declines STD testing.  Assessment:     1. Urinary urgency   2. Urinary frequency   3. Vaginal odor   4. Hematuria, unspecified type       Plan:     UA C&S sent  Meds ordered this encounter  Medications  . nitrofurantoin, macrocrystal-monohydrate, (MACROBID) 100 MG capsule    Sig: Take 1 capsule (100 mg total) by mouth 2 (two) times daily.    Dispense:  14 capsule    Refill:  0    Order Specific Question:   Supervising Provider    Answer:   Florian Buff [[id number]]  Push water Follow up prn

## 2017-02-21 NOTE — Patient Instructions (Signed)
Push water Follow up prn

## 2017-02-22 LAB — URINALYSIS, ROUTINE W REFLEX MICROSCOPIC
Bilirubin, UA: NEGATIVE
Glucose, UA: NEGATIVE
KETONES UA: NEGATIVE
NITRITE UA: NEGATIVE
PH UA: 6.5 (ref 5.0–7.5)
RBC, UA: NEGATIVE
Specific Gravity, UA: 1.026 (ref 1.005–1.030)
Urobilinogen, Ur: 1 mg/dL (ref 0.2–1.0)

## 2017-02-22 LAB — MICROSCOPIC EXAMINATION

## 2017-02-23 LAB — URINE CULTURE

## 2017-02-25 ENCOUNTER — Telehealth: Payer: Self-pay | Admitting: Adult Health

## 2017-02-25 MED ORDER — AMPICILLIN 500 MG PO CAPS
500.0000 mg | ORAL_CAPSULE | Freq: Three times a day (TID) | ORAL | 0 refills | Status: DC
Start: 2017-02-25 — End: 2017-07-04

## 2017-02-25 NOTE — Telephone Encounter (Signed)
leftt message that urine showed group B strept, and that ampicillin sent to [facility]

## 2017-02-25 NOTE — Telephone Encounter (Signed)
Pt aware of urine and has picked up RX to  Treat group B strep

## 2017-02-27 ENCOUNTER — Telehealth: Payer: Self-pay | Admitting: Adult Health

## 2017-02-27 NOTE — Telephone Encounter (Signed)
Pt says she is having some swelling in vagina and it hurts to walk Counseling given: Not Answered  come in tomorrow at 8:30 to be seen

## 2017-02-28 ENCOUNTER — Ambulatory Visit (INDEPENDENT_AMBULATORY_CARE_PROVIDER_SITE_OTHER): Payer: Self-pay | Admitting: Adult Health

## 2017-02-28 ENCOUNTER — Encounter: Payer: Self-pay | Admitting: Adult Health

## 2017-02-28 VITALS — BP 92/60 | HR 88 | Ht 62.5 in | Wt 110.5 lb

## 2017-02-28 DIAGNOSIS — B009 Herpesviral infection, unspecified: Secondary | ICD-10-CM | POA: Insufficient documentation

## 2017-02-28 DIAGNOSIS — N898 Other specified noninflammatory disorders of vagina: Secondary | ICD-10-CM

## 2017-02-28 DIAGNOSIS — R102 Pelvic and perineal pain: Secondary | ICD-10-CM

## 2017-02-28 LAB — POCT WET PREP (WET MOUNT): WBC, Wet Prep HPF POC: POSITIVE

## 2017-02-28 MED ORDER — VALACYCLOVIR HCL 1 G PO TABS: 1000.0000 mg | ORAL_TABLET | Freq: Two times a day (BID) | ORAL | 1 refills | Status: DC

## 2017-02-28 NOTE — Progress Notes (Signed)
Subjective:     Patient ID: Terry Wallace, female   DOB: May 16, 1996, 21 y.o.   MRN: [id number]  HPI Terry Wallace is a 21 year old black female in complaining of vaginal swelling and soreness with discharge for about 2 days, she is taking antibiotic.She says it hurts to walk, no fever.  Review of Systems +vaginal swelling and soreness and discharge for about 2 days now, hurts to walk No fever Reviewed past medical,surgical, social and family history. Reviewed medications and allergies.     Objective:   Physical Exam BP 92/60 (BP Location: Left Arm, Patient Position: Sitting, Cuff Size: Normal)   Pulse 88   Ht 5' 2.5" (1.588 m)   Wt 110 lb 8 oz (50.1 kg)   BMI 19.89 kg/m    Skin warm and dry.Pelvic: external genitalia is normal in appearance, except for several vesicles, on right and left labia, vagina: watery discharge without odor,urethra has no lesions or masses, noted, did not due full pelvic exam, she was too uncomfortable.Wet prep:  +WBCs.HSV culture obtained. Discussed with pt that this looks like herpes and will treat with valtrex and can use warm water in squirt bottle when voiding and for cleansing.  Assessment:     1. Vaginal pain   2. Vaginal discharge   3. Herpes       Plan:     Rx valtrex 1 gm #20 take 1 bid with 1 refill HSV culture sent Review handout on herpes by [employer]  Follow up prn

## 2017-03-03 LAB — HERPES SIMPLEX VIRUS CULTURE

## 2017-07-04 ENCOUNTER — Encounter: Payer: Self-pay | Admitting: Adult Health

## 2017-07-04 ENCOUNTER — Ambulatory Visit (INDEPENDENT_AMBULATORY_CARE_PROVIDER_SITE_OTHER): Payer: Medicaid Other | Admitting: Adult Health

## 2017-07-04 VITALS — BP 110/58 | HR 89 | Ht 62.5 in | Wt 111.0 lb

## 2017-07-04 DIAGNOSIS — B9689 Other specified bacterial agents as the cause of diseases classified elsewhere: Secondary | ICD-10-CM

## 2017-07-04 DIAGNOSIS — N760 Acute vaginitis: Principal | ICD-10-CM

## 2017-07-04 DIAGNOSIS — Z113 Encounter for screening for infections with a predominantly sexual mode of transmission: Secondary | ICD-10-CM

## 2017-07-04 DIAGNOSIS — N898 Other specified noninflammatory disorders of vagina: Secondary | ICD-10-CM | POA: Insufficient documentation

## 2017-07-04 DIAGNOSIS — N920 Excessive and frequent menstruation with regular cycle: Secondary | ICD-10-CM | POA: Insufficient documentation

## 2017-07-04 LAB — POCT WET PREP (WET MOUNT)
CLUE CELLS WET PREP WHIFF POC: POSITIVE
WBC WET PREP: POSITIVE

## 2017-07-04 MED ORDER — METRONIDAZOLE 0.75 % VA GEL: 1.0000 | Freq: Every day | VAGINAL | 0 refills | Status: DC

## 2017-07-04 NOTE — Progress Notes (Signed)
Subjective:     Patient ID: Terry Wallace, female   DOB: 1996/08/20, 21 y.o.   MRN: [id number]  HPI Terry Wallace is a 21 year old black female in complaining of vaginal odor for about a month and spotting for about a week, has little to no discharge that she has noted.   Review of Systems  +vaginal odor for about a month and spotting for about a week Little to no discharge noted  Reviewed past medical,surgical, social and family history. Reviewed medications and allergies.     Objective:   Physical Exam BP (!) 110/58 (BP Location: Left Arm, Patient Position: Sitting, Cuff Size: Normal)   Pulse 89   Ht 5' 2.5" (1.588 m)   Wt 111 lb (50.3 kg)   BMI 19.98 kg/m    Skin warm and dry.Pelvic: external genitalia is normal in appearance no lesions, vagina: pinkish discharge with odor,urethra has no lesions or masses noted, cervix:smooth, uterus: normal size, shape and contour, non tender, no masses felt, adnexa: no masses or tenderness noted. Bladder is non tender and no masses felt. Wet prep: + for clue cells and +WBCs. GC/CHL obtained.  Assessment:     1. BV (bacterial vaginosis)   2. Vaginal odor   3. Spotting   4. Screening examination for STD (sexually transmitted disease)       Plan:    GC/CHL sent  Meds ordered this encounter  Medications  . metroNIDAZOLE (METROGEL VAGINAL) 0.75 % vaginal gel    Sig: Place 1 Applicatorful vaginally at bedtime.    Dispense:  70 g    Refill:  0    Order Specific Question:   Supervising Provider    Answer:   Florian Buff [[id number]]  Use condoms Follow up prn

## 2017-07-06 LAB — GC/CHLAMYDIA PROBE AMP
Chlamydia trachomatis, NAA: NEGATIVE
Neisseria gonorrhoeae by PCR: NEGATIVE

## 2017-07-18 ENCOUNTER — Encounter: Payer: Self-pay | Admitting: Adult Health

## 2017-07-25 ENCOUNTER — Other Ambulatory Visit: Payer: Self-pay | Admitting: Adult Health

## 2017-08-01 ENCOUNTER — Telehealth: Payer: Self-pay | Admitting: *Deleted

## 2017-10-18 ENCOUNTER — Other Ambulatory Visit: Payer: Self-pay | Admitting: Adult Health

## 2017-12-11 ENCOUNTER — Ambulatory Visit: Payer: Self-pay | Admitting: Adult Health

## 2017-12-24 ENCOUNTER — Ambulatory Visit: Payer: Medicaid Other | Admitting: Adult Health

## 2017-12-25 ENCOUNTER — Ambulatory Visit (INDEPENDENT_AMBULATORY_CARE_PROVIDER_SITE_OTHER): Payer: Medicaid Other | Admitting: Adult Health

## 2017-12-25 ENCOUNTER — Other Ambulatory Visit (HOSPITAL_COMMUNITY)
Admission: RE | Admit: 2017-12-25 | Discharge: 2017-12-25 | Disposition: A | Discharge status: Home / Self Care | Payer: Self-pay | Source: Ambulatory Visit | Attending: Adult Health | Admitting: Adult Health

## 2017-12-25 ENCOUNTER — Encounter: Payer: Self-pay | Admitting: Adult Health

## 2017-12-25 VITALS — BP 100/60 | HR 82 | Ht 62.2 in | Wt 112.0 lb

## 2017-12-25 DIAGNOSIS — Z01419 Encounter for gynecological examination (general) (routine) without abnormal findings: Secondary | ICD-10-CM | POA: Insufficient documentation

## 2017-12-25 DIAGNOSIS — Z309 Encounter for contraceptive management, unspecified: Secondary | ICD-10-CM

## 2017-12-25 DIAGNOSIS — F329 Major depressive disorder, single episode, unspecified: Secondary | ICD-10-CM

## 2017-12-25 DIAGNOSIS — Z113 Encounter for screening for infections with a predominantly sexual mode of transmission: Secondary | ICD-10-CM

## 2017-12-25 DIAGNOSIS — Z3009 Encounter for other general counseling and advice on contraception: Secondary | ICD-10-CM

## 2017-12-25 DIAGNOSIS — Z8742 Personal history of other diseases of the female genital tract: Secondary | ICD-10-CM | POA: Insufficient documentation

## 2017-12-25 DIAGNOSIS — F32A Depression, unspecified: Secondary | ICD-10-CM

## 2017-12-25 DIAGNOSIS — Z3041 Encounter for surveillance of contraceptive pills: Secondary | ICD-10-CM

## 2017-12-25 MED ORDER — NORETHIN ACE-ETH ESTRAD-FE 1-20 MG-MCG(24) PO CAPS: 1.0000 | ORAL_CAPSULE | Freq: Every day | ORAL | 4 refills | Status: DC

## 2017-12-25 NOTE — Addendum Note (Signed)
Addended by: Diona Fanti A on: 12/25/2017 09:04 AM   Modules accepted: Orders

## 2017-12-25 NOTE — Progress Notes (Signed)
Patient ID: Terry Wallace, female   DOB: 07/22/1996, 22 y.o.   MRN: [id number] History of Present Illness: Terry Wallace is a 22 year old black female in of well woman gyn exam and pap.She had ASCUS with +HPV 10/10/16, had normal colpo.She is happy with [name]. She has [employer].    Current Medications, Allergies, Past Medical History, Past Surgical History, Family History and Social History were reviewed in [employer] record.     Review of Systems: Patient denies any headaches, hearing loss, fatigue, blurred vision, shortness of breath, chest pain, abdominal pain, problems with bowel movements, urination, or intercourse. No joint pain or mood swings.    Physical Exam:BP 100/60 (BP Location: Left Arm, Patient Position: Sitting, Cuff Size: Small)   Pulse 82   Ht 5' 2.2" (1.58 m)   Wt 112 lb (50.8 kg)   BMI 20.35 kg/m  General:  Well developed, well nourished, no acute distress Skin:  Warm and dry Neck:  Midline trachea, normal thyroid, good ROM, no lymphadenopathy Lungs; Clear to auscultation bilaterally Breast:  No dominant palpable mass, retraction, or nipple discharge Cardiovascular: Regular rate and rhythm Abdomen:  Soft, non tender, no hepatosplenomegaly Pelvic:  External genitalia is normal in appearance, no lesions.  The vagina is normal in appearance. Urethra has no lesions or masses. The cervix is smooth, pap with HPV and GC/CHL performed.  Uterus is felt to be normal size, shape, and contour.  No adnexal masses or tenderness noted.Bladder is non tender, no masses felt. Extremities/musculoskeletal:  No swelling or varicosities noted, no clubbing or cyanosis Psych:  No mood changes, alert and cooperative,seems happy PHQ 9 score 9, denies being depressed or suicidal, declines meds   Impression:  1. Encounter for gynecological examination with Papanicolaou smear of cervix   2. History of abnormal cervical Pap smear   3. Encounter for surveillance  of contraceptive pills   4. Screening examination for STD (sexually transmitted disease)   5. Family planning   6. Depression, unspecified depression type      Plan: Check HIV and RPR Meds ordered this encounter  Medications  . Norethin Ace-Eth Estrad-FE ([name]) 1-20 MG-MCG(24) CAPS    Sig: Take 1 capsule by mouth daily.    Dispense:  84 capsule    Refill:  4    Order Specific Question:   Supervising Provider    Answer:   Florian Buff [[id number]]  Physical in 1 year, pap in 3 if normal

## 2017-12-26 LAB — HIV ANTIBODY (ROUTINE TESTING W REFLEX): HIV SCREEN 4TH GENERATION: NONREACTIVE

## 2017-12-26 LAB — RPR: RPR: NONREACTIVE

## 2017-12-27 LAB — CYTOLOGY - PAP
CHLAMYDIA, DNA PROBE: POSITIVE — AB
Diagnosis: NEGATIVE
NEISSERIA GONORRHEA: NEGATIVE

## 2017-12-30 ENCOUNTER — Telehealth: Payer: Self-pay | Admitting: Adult Health

## 2017-12-30 ENCOUNTER — Encounter: Payer: Self-pay | Admitting: Adult Health

## 2017-12-30 DIAGNOSIS — A749 Chlamydial infection, unspecified: Secondary | ICD-10-CM

## 2017-12-30 HISTORY — DX: Chlamydial infection, unspecified: A74.9

## 2017-12-30 MED ORDER — AZITHROMYCIN 500 MG PO TABS: ORAL_TABLET | ORAL | 0 refills | Status: DC

## 2017-12-30 NOTE — Telephone Encounter (Signed)
Pt aware +chlamydia on pap, will rx azithromycin 500 mg #2 2 po now, partner to clinic, no sex, POC 2/18 at 8:30 am. [facility] sent

## 2018-01-27 ENCOUNTER — Ambulatory Visit (INDEPENDENT_AMBULATORY_CARE_PROVIDER_SITE_OTHER): Payer: Medicaid Other | Admitting: Adult Health

## 2018-01-27 ENCOUNTER — Encounter: Payer: Self-pay | Admitting: Adult Health

## 2018-01-27 VITALS — BP 98/60 | HR 88 | Ht 62.5 in | Wt 112.5 lb

## 2018-01-27 DIAGNOSIS — Z8619 Personal history of other infectious and parasitic diseases: Secondary | ICD-10-CM

## 2018-01-27 DIAGNOSIS — Z113 Encounter for screening for infections with a predominantly sexual mode of transmission: Secondary | ICD-10-CM

## 2018-01-27 NOTE — Progress Notes (Signed)
Subjective:     Patient ID: Terry Wallace, female   DOB: June 21, 1996, 22 y.o.   MRN: [id number]  HPI Terry Wallace is a 22 year old black female in for proof of cure for recent chlamydia.She took her meds and has not had sex,and partner took his meds too. No complaints.   Review of Systems Patient denies any headaches, hearing loss, fatigue, blurred vision, shortness of breath, chest pain, abdominal pain, problems with bowel movements, urination, or intercourse. No joint pain or mood swings. Reviewed past medical,surgical, social and family history. Reviewed medications and allergies.     Objective:   Physical Exam BP 98/60 (BP Location: Left Arm, Patient Position: Sitting, Cuff Size: Normal)   Pulse 88   Ht 5' 2.5" (1.588 m)   Wt 112 lb 8 oz (51 kg)   LMP  (LMP Unknown)   BMI 20.25 kg/m  Skin warm and dry.Pelvic: external genitalia is normal in appearance no lesions, vagina: white discharge without odor,urethra has no lesions or masses noted, cervix:smooth, uterus: normal size, shape and contour, non tender, no masses felt, adnexa: no masses or tenderness noted. Bladder is non tender and no masses felt.  GC/CHL obtained.     Assessment:     1. History of chlamydia   2. Screening examination for STD (sexually transmitted disease)       Plan:     GC/CHL sent Use condoms F/U prn

## 2018-01-27 NOTE — Patient Instructions (Signed)
Use condoms  

## 2018-01-29 LAB — GC/CHLAMYDIA PROBE AMP
Chlamydia trachomatis, NAA: NEGATIVE
Neisseria gonorrhoeae by PCR: NEGATIVE

## 2018-03-21 ENCOUNTER — Telehealth: Payer: Self-pay | Admitting: Adult Health

## 2018-03-21 MED ORDER — NORETHIN ACE-ETH ESTRAD-FE 1-20 MG-MCG(24) PO CAPS: 1.0000 | ORAL_CAPSULE | Freq: Every day | ORAL | 4 refills | Status: DC

## 2018-03-21 NOTE — Telephone Encounter (Signed)
Spoke with pt. Pt needs a refill on her birth control. She don't take the sugar pills and she has run out. Please state that she don't take sugar pills. Thanks!! [place]

## 2018-03-21 NOTE — Telephone Encounter (Signed)
Left message that rx sent to take continuously

## 2018-04-25 ENCOUNTER — Ambulatory Visit: Payer: Medicaid Other | Admitting: Women's Health

## 2018-12-18 ENCOUNTER — Other Ambulatory Visit: Payer: Self-pay | Admitting: Adult Health

## 2019-02-11 NOTE — Telephone Encounter (Signed)
Note sent to provider 

## 2019-02-27 ENCOUNTER — Other Ambulatory Visit: Payer: Self-pay | Admitting: Adult Health

## 2019-06-16 ENCOUNTER — Telehealth: Payer: Self-pay | Admitting: Adult Health

## 2019-06-16 NOTE — Telephone Encounter (Signed)
Pt states that she is having irregular bleeding and requesting to speak with a nurse or to make an appt.

## 2019-06-19 ENCOUNTER — Other Ambulatory Visit: Payer: Self-pay | Admitting: Adult Health

## 2019-06-19 MED ORDER — METRONIDAZOLE 500 MG PO TABS: 500.0000 mg | ORAL_TABLET | Freq: Two times a day (BID) | ORAL | 0 refills | Status: DC

## 2019-06-19 NOTE — Progress Notes (Signed)
rx flagyl  

## 2019-06-25 ENCOUNTER — Other Ambulatory Visit: Payer: Self-pay

## 2019-06-25 DIAGNOSIS — Z20822 Contact with and (suspected) exposure to covid-19: Secondary | ICD-10-CM

## 2019-06-28 LAB — NOVEL CORONAVIRUS, NAA: SARS-CoV-2, NAA: NOT DETECTED

## 2020-04-05 ENCOUNTER — Other Ambulatory Visit: Payer: Self-pay | Admitting: Adult Health

## 2020-05-17 ENCOUNTER — Encounter: Payer: Self-pay | Admitting: Adult Health

## 2020-05-17 ENCOUNTER — Ambulatory Visit (INDEPENDENT_AMBULATORY_CARE_PROVIDER_SITE_OTHER): Payer: Self-pay | Admitting: Adult Health

## 2020-05-17 VITALS — BP 124/86 | HR 102 | Ht 62.5 in | Wt 112.0 lb

## 2020-05-17 DIAGNOSIS — N898 Other specified noninflammatory disorders of vagina: Secondary | ICD-10-CM

## 2020-05-17 DIAGNOSIS — N760 Acute vaginitis: Secondary | ICD-10-CM

## 2020-05-17 DIAGNOSIS — Z01419 Encounter for gynecological examination (general) (routine) without abnormal findings: Secondary | ICD-10-CM | POA: Insufficient documentation

## 2020-05-17 DIAGNOSIS — B9689 Other specified bacterial agents as the cause of diseases classified elsewhere: Secondary | ICD-10-CM

## 2020-05-17 LAB — POCT WET PREP (WET MOUNT)
Clue Cells Wet Prep Whiff POC: POSITIVE
WBC, Wet Prep HPF POC: POSITIVE

## 2020-05-17 MED ORDER — METRONIDAZOLE 500 MG PO TABS: 500.0000 mg | ORAL_TABLET | Freq: Two times a day (BID) | ORAL | 0 refills | Status: DC

## 2020-05-17 NOTE — Progress Notes (Signed)
Patient ID: Terry Wallace, female   DOB: 09-Sep-1996, 24 y.o.   MRN: [id number] History of Present Illness: Terry Wallace is a 24 year old black female, single, G0P0, in for well woman gyn exam.She had normal pap in [place] last year she says. She stopped birth control in December and periods regular, and she is using condoms. She back home and taking massage therapy classes, boyfriend is in [place], she used to Radiation protection practitioner for [employer].  PCP is Dr Karie Kirks.   Current Medications, Allergies, Past Medical History, Past Surgical History, Family History and Social History were reviewed in [employer] record.     Review of Systems: Patient denies any headaches, hearing loss, fatigue, blurred vision, shortness of breath, chest pain, abdominal pain, problems with bowel movements, urination, or intercourse. No joint pain or mood swings. Has noticed vaginal discharge with odor for a few weeks, denies any itching ?hemorrhoid   Physical Exam:BP 124/86 (BP Location: Left Arm, Patient Position: Sitting, Cuff Size: Normal)   Pulse (!) 102   Ht 5' 2.5" (1.588 m)   Wt 112 lb (50.8 kg)   LMP 05/10/2020   BMI 20.16 kg/m  General:  Well developed, well nourished, no acute distress Skin:  Warm and dry Neck:  Midline trachea, normal thyroid, good ROM, no lymphadenopathy Lungs; Clear to auscultation bilaterally Breast:  No dominant palpable mass, retraction, or nipple discharge Cardiovascular: Regular rate and rhythm Abdomen:  Soft, non tender, no hepatosplenomegaly Pelvic:  External genitalia is normal in appearance, no lesions.  The vagina is normal in appearance, has creamy vaginal discharge with odor. Urethra has no lesions or masses. The cervix is nulliparous.  Uterus is felt to be normal size, shape, and contour.  No adnexal masses or tenderness noted.Bladder is non tender, no masses felt. Wet prep:+WBCs and +clues cells, Extremities/musculoskeletal:  No swelling or varicosities noted, no clubbing or  cyanosis Psych:  No mood changes, alert and cooperative,seems happy AA 1 Fall risk is low PHQ 9 score 8, no SI.declines meds Co exam with Terri Skains NP student She declines STD testing.   Impression and Plan: 1. Encounter for well woman exam with routine gynecological exam Physical in 1 year Pap in 2023  2. Vaginal discharge  3. Vaginal odor   4. BV (bacterial vaginosis) Will rx flagyl Meds ordered this encounter  Medications  . metroNIDAZOLE (FLAGYL) 500 MG tablet    Sig: Take 1 tablet (500 mg total) by mouth 2 (two) times daily.    Dispense:  14 tablet    Refill:  0    Order Specific Question:   Supervising Provider    Answer:   Tania Ade H [[id number]]   No sex or alcohol during treatment

## 2021-04-26 DIAGNOSIS — Z01419 Encounter for gynecological examination (general) (routine) without abnormal findings: Secondary | ICD-10-CM | POA: Diagnosis not present

## 2021-04-26 DIAGNOSIS — Z681 Body mass index (BMI) 19 or less, adult: Secondary | ICD-10-CM | POA: Diagnosis not present

## 2021-04-26 DIAGNOSIS — Z113 Encounter for screening for infections with a predominantly sexual mode of transmission: Secondary | ICD-10-CM | POA: Diagnosis not present

## 2021-04-26 LAB — HM PAP SMEAR: HM Pap smear: NEGATIVE

## 2021-05-24 ENCOUNTER — Ambulatory Visit: Payer: Self-pay | Admitting: Nurse Practitioner

## 2021-05-30 DIAGNOSIS — D251 Intramural leiomyoma of uterus: Secondary | ICD-10-CM | POA: Diagnosis not present

## 2021-06-13 ENCOUNTER — Other Ambulatory Visit: Payer: Self-pay | Admitting: Obstetrics & Gynecology

## 2021-06-13 DIAGNOSIS — Q519 Congenital malformation of uterus and cervix, unspecified: Secondary | ICD-10-CM

## 2021-06-25 ENCOUNTER — Ambulatory Visit
Admission: RE | Admit: 2021-06-25 | Discharge: 2021-06-25 | Disposition: A | Discharge status: Home / Self Care | Payer: BC Managed Care – PPO | Source: Ambulatory Visit | Attending: Obstetrics & Gynecology | Admitting: Obstetrics & Gynecology

## 2021-06-25 DIAGNOSIS — Q519 Congenital malformation of uterus and cervix, unspecified: Secondary | ICD-10-CM

## 2021-06-25 DIAGNOSIS — D259 Leiomyoma of uterus, unspecified: Secondary | ICD-10-CM | POA: Diagnosis not present

## 2021-06-25 DIAGNOSIS — Q51810 Arcuate uterus: Secondary | ICD-10-CM | POA: Diagnosis not present

## 2021-07-03 ENCOUNTER — Ambulatory Visit: Payer: Self-pay | Admitting: Family Medicine

## 2021-07-03 DIAGNOSIS — Z20822 Contact with and (suspected) exposure to covid-19: Secondary | ICD-10-CM | POA: Diagnosis not present

## 2021-07-07 DIAGNOSIS — Z20822 Contact with and (suspected) exposure to covid-19: Secondary | ICD-10-CM | POA: Diagnosis not present

## 2021-11-14 IMAGING — MR MR PELVIS W/O CM
5 of 9 series · 23 of 48 positions shown · non-contrast
Comparison: None.

CLINICAL DATA: Congenital malformation of the uterus, uterine
fibroid.

EXAM:
MRI PELVIS WITHOUT CONTRAST
TECHNIQUE: Multiplanar multisequence MR imaging of the pelvis was performed. No
intravenous contrast was administered.

[Series 3: T2 · coronal · 6.0mm · 0.78mm/px · 4 of 27 slices shown]
[im 1/27]
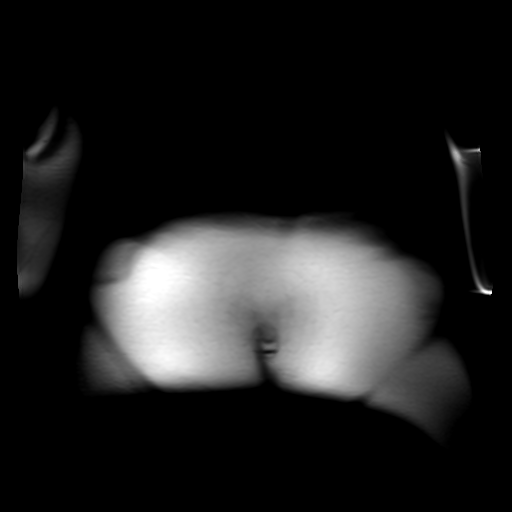
[im 9/27]
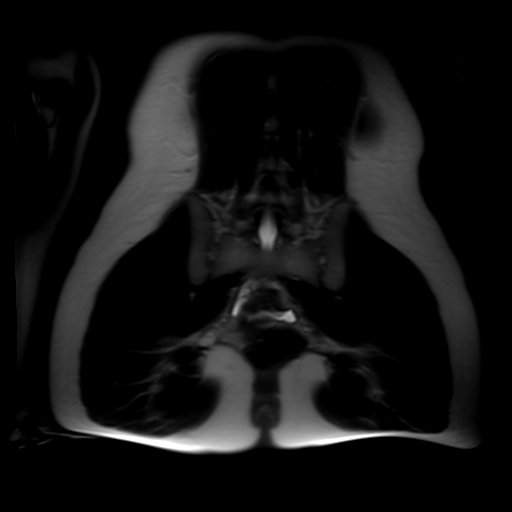
[im 18/27]
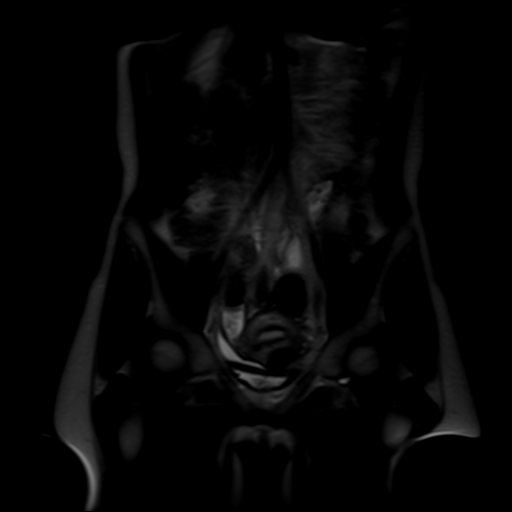
[im 27/27]
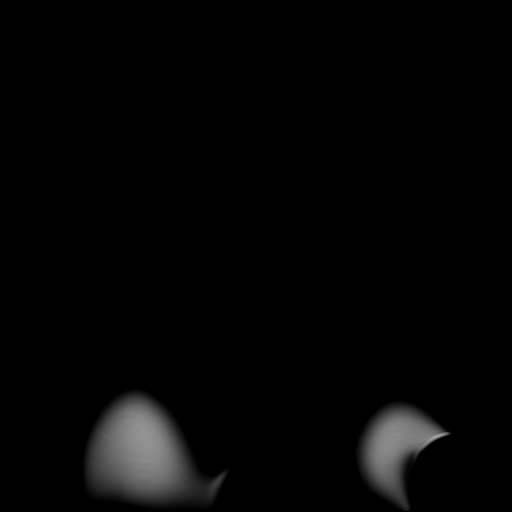

[Series 4: t2_tse axial · axial · 5.0mm · 0.51mm/px · z∈[-90,+73]mm · 5 of 27 slices shown]
[im 1/27]
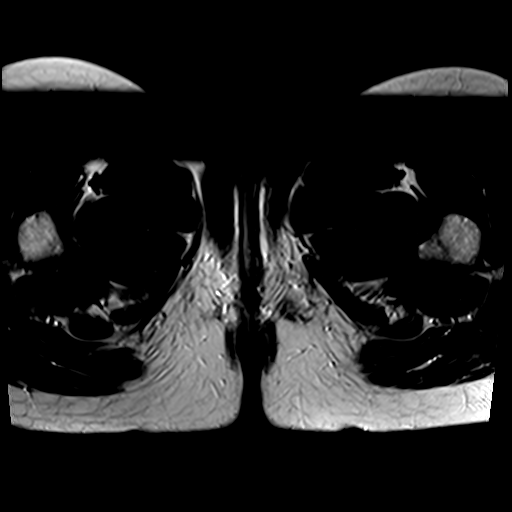
[im 7/27]
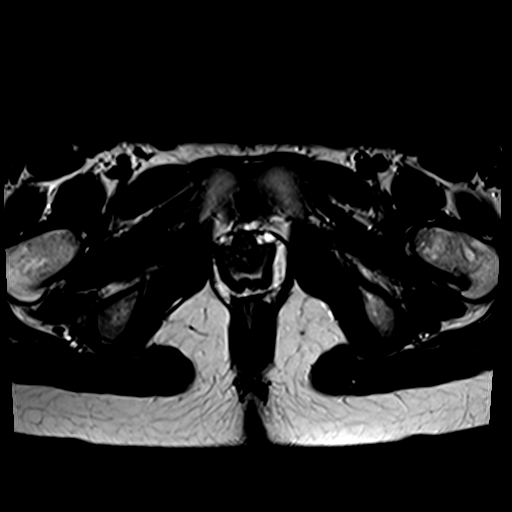
[im 14/27]
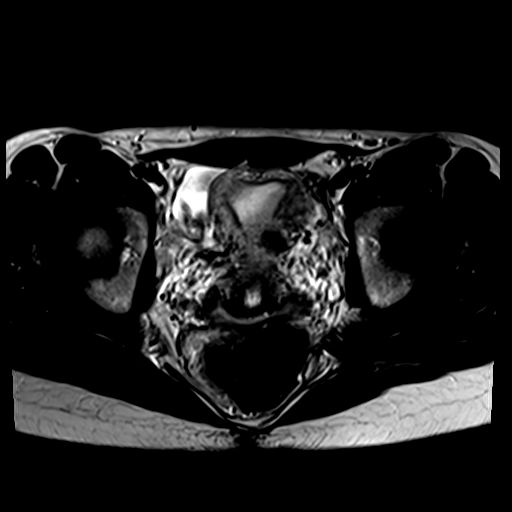
[im 20/27]
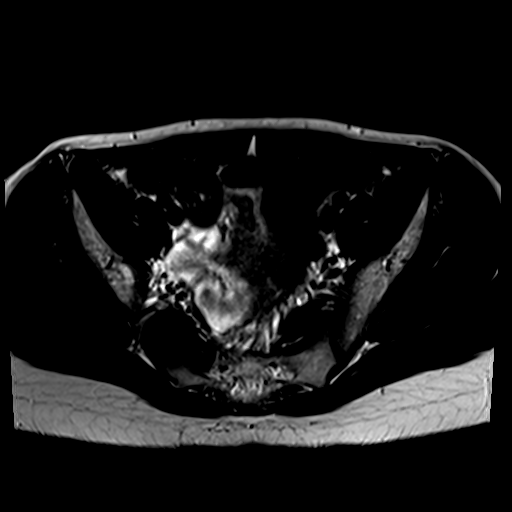
[im 27/27]
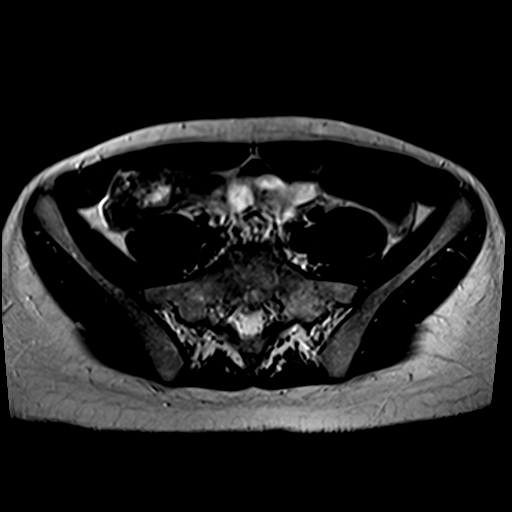

[Series 5: t2_tse_sag · sagittal · 5.0mm · 0.98mm/px · 4 of 25 slices shown]
[im 1/25]
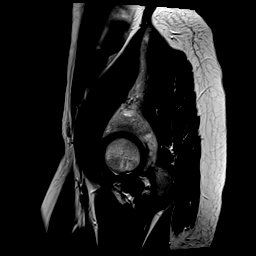
[im 9/25]
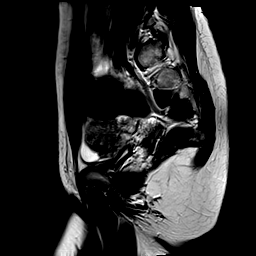
[im 17/25]
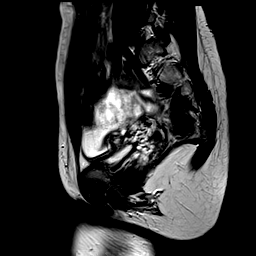
[im 25/25]
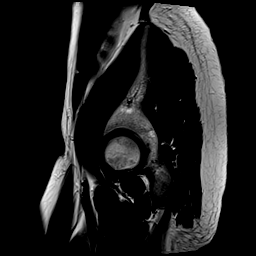

[Series 6: t2_tse axial fs · axial · 5.0mm · 0.51mm/px · z∈[-90,+73]mm · 5 of 27 slices shown]
[im 1/27]
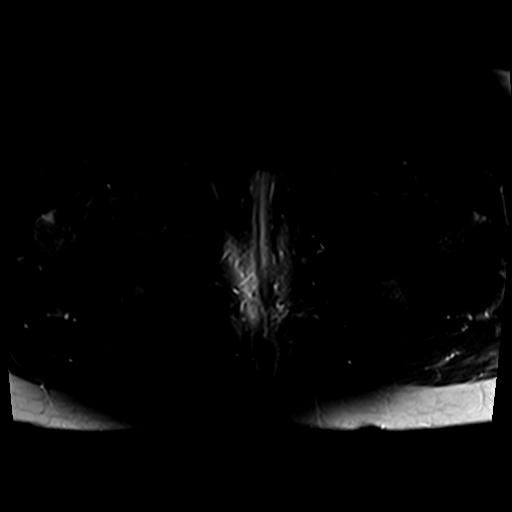
[im 7/27]
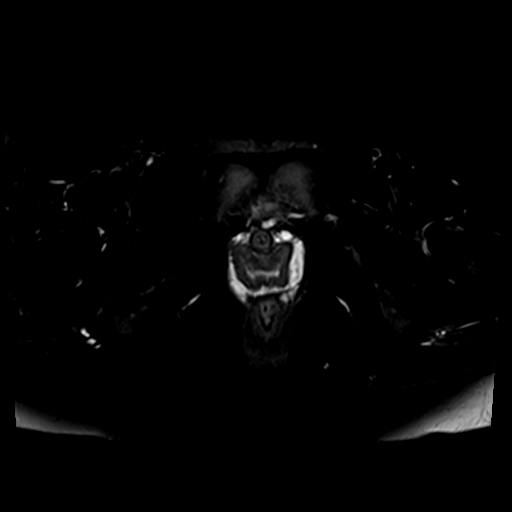
[im 14/27]
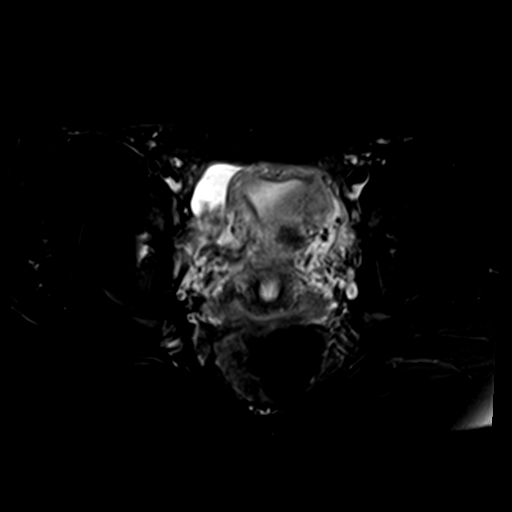
[im 20/27]
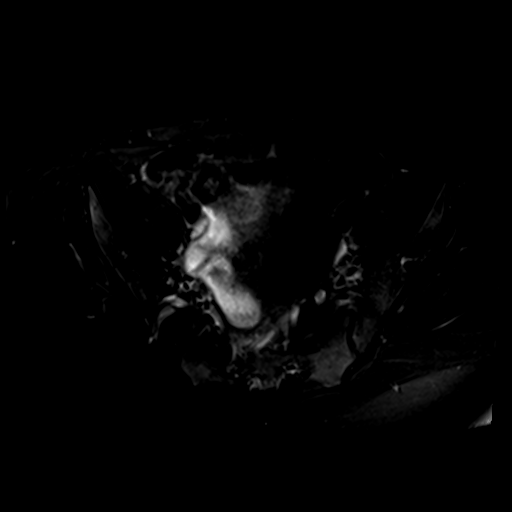
[im 27/27]
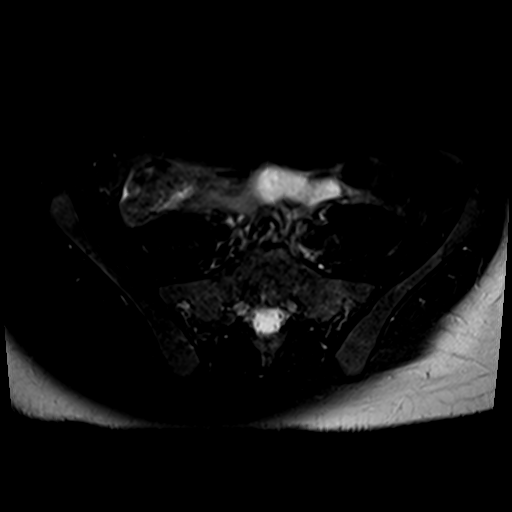

[Series 7: axial in out · axial · 5.5mm · 0.70mm/px · z∈[-100,+7]mm · 5 of 60 slices shown]
[im 1/60]
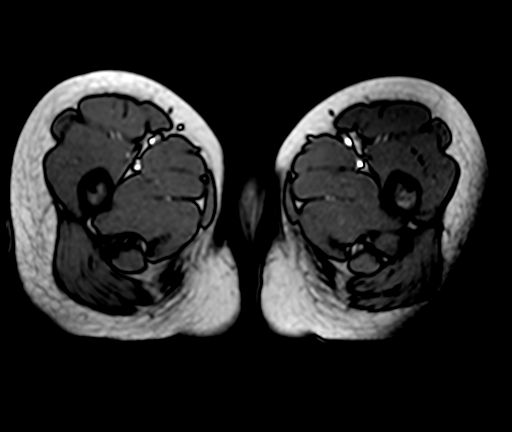
[im 12/60]
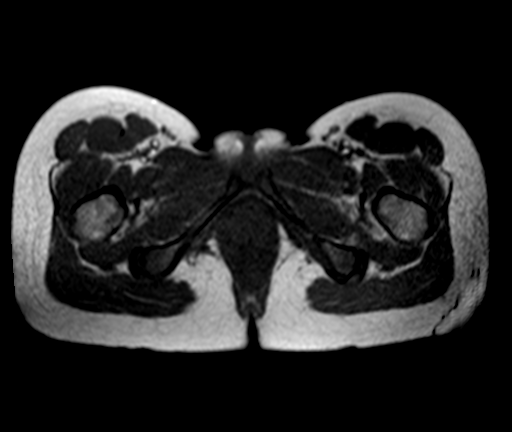
[im 18/60]
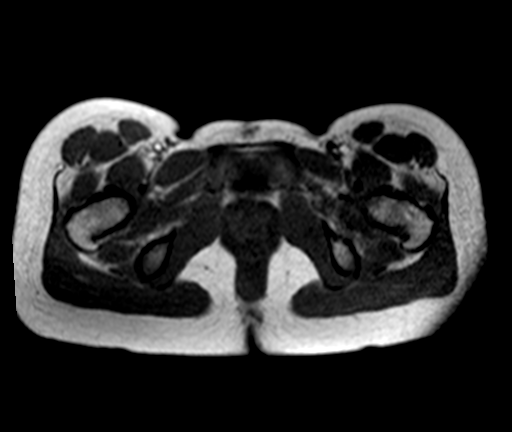
[im 24/60]
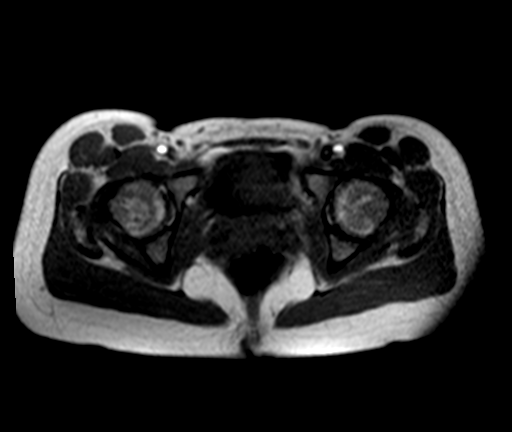
[im 36/60]
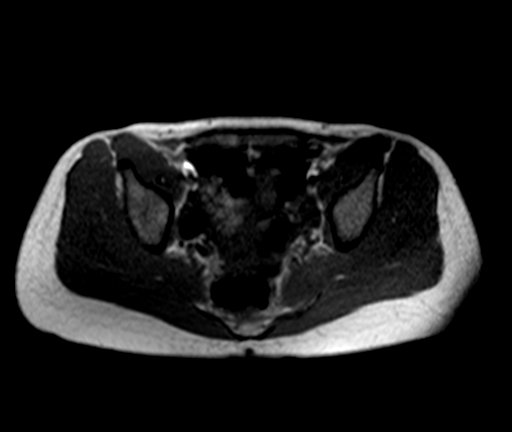

[23 of 48 positions shown; findings below may reference images not displayed]

FINDINGS: Urinary Tract: Unremarkable urinary bladder.

Bowel: Unremarkable pelvic bowel loops.

Vascular/Lymphatic: No pathologically enlarged lymph nodes or other
significant abnormality.

Reproductive:

Uterus: Measures 7.5 x 5.0 x 3.6 cm. Endometrial stripe measures 7
mm.

Uterus is anteverted and anteflexed. There is normal uterine contour
without diversion of uterine horns, with smooth indentation of the
fundal endometrial canal and the depth of indentation approximately
6 mm.

Leiomyomatous uterus, approximately 3, all of which appear to be
intramural the largest of which is in the anterior aspect of the
uterus and measures approximately 1.6 cm in maximum dimension. No
pedunculated or submucosal fibroids visualized.

Right ovary: Simple appearing physiologic 2.1 cm right ovarian cyst,
otherwise the ovary appears unremarkable.

Left ovary:  Appears normal. No mass identified.

Other: No pelvic ascites.

Musculoskeletal:  No suspicious osseous lesion identified.
IMPRESSION: 1. Arcuate uterus with multiple uterine fibroids, as above.

## 2021-12-07 ENCOUNTER — Other Ambulatory Visit: Payer: Self-pay

## 2021-12-07 ENCOUNTER — Encounter: Payer: Self-pay | Admitting: Emergency Medicine

## 2021-12-07 ENCOUNTER — Ambulatory Visit
Admission: EM | Admit: 2021-12-07 | Discharge: 2021-12-07 | Disposition: A | Discharge status: Home / Self Care | Payer: BC Managed Care – PPO | Attending: Physician Assistant | Admitting: Physician Assistant

## 2021-12-07 DIAGNOSIS — M791 Myalgia, unspecified site: Secondary | ICD-10-CM | POA: Diagnosis not present

## 2021-12-07 MED ORDER — CYCLOBENZAPRINE HCL 10 MG PO TABS: 10.0000 mg | ORAL_TABLET | Freq: Two times a day (BID) | ORAL | 0 refills | Status: DC | PRN

## 2021-12-07 MED ORDER — PREDNISONE 20 MG PO TABS: 40.0000 mg | ORAL_TABLET | Freq: Every day | ORAL | 0 refills | Status: AC
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
## 2021-12-08 ENCOUNTER — Other Ambulatory Visit: Payer: Self-pay

## 2021-12-08 ENCOUNTER — Ambulatory Visit
Admission: EM | Admit: 2021-12-08 | Discharge: 2021-12-08 | Disposition: A | Discharge status: Home / Self Care | Payer: BC Managed Care – PPO

## 2021-12-08 DIAGNOSIS — R0789 Other chest pain: Secondary | ICD-10-CM

## 2021-12-08 NOTE — Discharge Instructions (Addendum)
-  Continue the prednisone as prescribed -Tylenol for pain -Heating pad -Symptoms should slowly improve over the next 4-5 days, follow-up if they're getting worse instead of better, or new symptoms like shortness of breath, chest pain at rest, dizziness.
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
## 2022-03-27 NOTE — Progress Notes (Signed)
? ?New Patient Office Visit ? ?Subjective   ? ?Patient ID: Terry Wallace, female    DOB: 09-01-1996  Age: 26 y.o. MRN: [id number] ? ?CC:  ?Chief Complaint  ?Patient presents with  ? Establish Care  ?  Np. Est care. Pt c/o congested left side nostril for several years.   ? ? ?HPI ?Terry Wallace presents for new patient visit to establish care.  Introduced to Designer, [employer]ellery role and practice setting.  All questions answered.  Discussed provider/patient relationship and expectations. ? ?She has been having nasal congestion on and off for the last 3.5 years, mainly on the left nostril. She tried nasal strips, nasal sprays. This all started with a cold or sinus infection. She is not sure what is triggering the ongoing symptoms. She also tried zyrtec and allegra which helped her allergies, but not the nasal congestion. Denies pain, fevers, ear pain/pressure, sore throat.  ? ?Depression and Anxiety Screen done: ? ? ?  03/28/2022  ? 11:18 AM 05/17/2020  ?  1:43 PM 12/25/2017  ?  8:45 AM 10/10/2016  ?  3:18 PM  ?Depression screen PHQ 2/9  ?Decreased Interest 0 0 0 0  ?Down, Depressed, Hopeless 0 1 1 0  ?PHQ - 2 Score 0 1 1 0  ?Altered sleeping  2 3   ?Tired, decreased energy  1 2   ?Change in appetite  1 1   ?Feeling bad or failure about yourself   1 0   ?Trouble concentrating  1 2   ?Moving slowly or fidgety/restless  1 0   ?Suicidal thoughts  0 0   ?PHQ-9 Score  8 9   ?Difficult doing work/chores  Not difficult at all Not difficult at all   ? ? ?  05/17/2020  ?  1:44 PM  ?GAD 7 : Generalized Anxiety Score  ?Nervous, Anxious, on Edge 1  ?Control/stop worrying 1  ?Worry too much - different things 1  ?Trouble relaxing 1  ?Restless 1  ?Easily annoyed or irritable 1  ?Afraid - awful might happen 1  ?Total GAD 7 Score 7  ?Anxiety Difficulty Not difficult at all  ? ? ?Outpatient Encounter Medications as of 03/28/2022  ?Medication Sig  ? [DISCONTINUED] cyclobenzaprine (FLEXERIL) 10 MG tablet Take 1 tablet (10 mg total) by mouth 2 (two)  times daily as needed for muscle spasms.  ? [DISCONTINUED] metroNIDAZOLE (FLAGYL) 500 MG tablet Take 1 tablet (500 mg total) by mouth 2 (two) times daily.  ? ?No facility-administered encounter medications on file as of 03/28/2022.  ? ? ?Past Medical History:  ?Diagnosis Date  ? Allergic rhinitis   ? ASCUS with positive high risk HPV cervical 10/17/2016  ? Will get colpo  ? BV (bacterial vaginosis) 05/16/2015  ? Chlamydia 12/30/2017  ? Treated 12/30/17----POC  ? Contraceptive management 06/21/2014  ? Dysmenorrhea 06/21/2014  ? History of chlamydia 02/16/2016  ? Irregular bleeding 01/06/2015  ? Low back pain   ? Lumbago   ? Screening for STD (sexually transmitted disease) 01/13/2016  ? Swelling of labia 07/11/2016  ? Uterine fibroid   ? Vaginal discharge 05/16/2015  ? ? ?History reviewed. No pertinent surgical history. ? ?Family History  ?Problem Relation Age of Onset  ? Cancer Maternal Grandmother   ?     breast,bone,liver  ? Diabetes Maternal Grandfather   ? Cancer Maternal Grandfather   ?     prostate  ? Kidney failure Mother   ? Thyroid disease Maternal Aunt   ? ? ?  Social History  ? ?Socioeconomic History  ? Marital status: Single  ?  Spouse name: Not on file  ? Number of children: Not on file  ? Years of education: Not on file  ? Highest education level: Not on file  ?Occupational History  ? Not on file  ?Tobacco Use  ? Smoking status: Never  ? Smokeless tobacco: Never  ?Vaping Use  ? Vaping Use: Never used  ?Substance and Sexual Activity  ? Alcohol use: Yes  ?  Comment: occ  ? Drug use: No  ? Sexual activity: Yes  ?  Birth control/protection: Condom, None  ?Other Topics Concern  ? Not on file  ?Social History Narrative  ? Not on file  ? ?Social Determinants of Health  ? ?Financial Resource Strain: Not on file  ?Food Insecurity: Not on file  ?Transportation Needs: Not on file  ?Physical Activity: Not on file  ?Stress: Not on file  ?Social Connections: Not on file  ?Intimate Partner Violence: Not on file   ? ? ?Review of Systems  ?Constitutional: Negative.   ?HENT:  Positive for congestion. Negative for ear pain, nosebleeds and tinnitus.   ?Eyes: Negative.   ?Respiratory: Negative.    ?Cardiovascular: Negative.   ?Gastrointestinal: Negative.   ?Genitourinary: Negative.   ?Musculoskeletal: Negative.   ?Skin: Negative.   ?Neurological: Negative.   ?Endo/Heme/Allergies:  Positive for environmental allergies.  ?Psychiatric/Behavioral: Negative.    ?  ?Objective   ? ?BP 129/84 (BP Location: Left Arm, Patient Position: Sitting, Cuff Size: Small)   Pulse 89   Temp (!) 97.5 ?F (36.4 ?C) (Temporal)   Ht '5\' 3"'$  (1.6 m)   Wt 116 lb 9.6 oz (52.9 kg)   LMP 03/20/2022 (Exact Date)   SpO2 100%   BMI 20.65 kg/m?  ? ?Physical Exam ?Vitals and nursing note reviewed.  ?Constitutional:   ?   General: She is not in acute distress. ?   Appearance: Normal appearance.  ?HENT:  ?   Head: Normocephalic.  ?   Right Ear: Tympanic membrane, ear canal and external ear normal.  ?   Left Ear: Tympanic membrane, ear canal and external ear normal.  ?   Nose: Mucosal edema present. No nasal deformity or signs of injury.  ?   Right Nostril: No occlusion.  ?   Left Nostril: No occlusion.  ?Eyes:  ?   Conjunctiva/sclera: Conjunctivae normal.  ?Cardiovascular:  ?   Rate and Rhythm: Normal rate and regular rhythm.  ?   Pulses: Normal pulses.  ?   Heart sounds: Normal heart sounds.  ?Pulmonary:  ?   Effort: Pulmonary effort is normal.  ?   Breath sounds: Normal breath sounds.  ?Musculoskeletal:  ?   Cervical back: Normal range of motion.  ?Skin: ?   General: Skin is warm.  ?Neurological:  ?   General: No focal deficit present.  ?   Mental Status: She is alert and oriented to person, place, and time.  ?Psychiatric:     ?   Mood and Affect: Mood normal.     ?   Behavior: Behavior normal.     ?   Thought Content: Thought content normal.     ?   Judgment: Judgment normal.  ? ?  ? ?Assessment & Plan:  ? ?Problem List Items Addressed This Visit   ? ?  ?  Other  ? Chronic nasal congestion - Primary  ?  Chronic, ongoing.  She states that she has tried multiple nasal  sprays, Zyrtec, Allegra and nothing is helped.  Symptoms gets worse with allergies and pollen, however it is intermittent throughout the year.  We will place referral to ENT for further evaluation. ? ?  ?  ? ?Other Visit Diagnoses   ? ? Need for HPV vaccination      ? HPV vaccine #2 given today  ? Relevant Orders  ? HPV 9-valent vaccine,Recombinat (Completed)  ? ?  ? ? ?Return in about 6 months (around 09/27/2022) for CPE.  ? ?Charyl Dancer, NP ? ? ?

## 2022-03-28 ENCOUNTER — Encounter: Payer: Self-pay | Admitting: Nurse Practitioner

## 2022-03-28 ENCOUNTER — Ambulatory Visit (INDEPENDENT_AMBULATORY_CARE_PROVIDER_SITE_OTHER): Payer: BC Managed Care – PPO | Admitting: Nurse Practitioner

## 2022-03-28 VITALS — BP 129/84 | HR 89 | Temp 97.5°F | Ht 63.0 in | Wt 116.6 lb

## 2022-03-28 DIAGNOSIS — Z23 Encounter for immunization: Secondary | ICD-10-CM | POA: Diagnosis not present

## 2022-03-28 DIAGNOSIS — R0981 Nasal congestion: Secondary | ICD-10-CM | POA: Diagnosis not present

## 2022-03-28 NOTE — Patient Instructions (Signed)
It was great to see you! ? ?I am placing a referral to ENT to further evaluate your nasal congestion  ? ?Let's follow-up in 6 months, sooner if you have concerns. ? ?If a referral was placed today, you will be contacted for an appointment. Please note that routine referrals can sometimes take up to 3-4 weeks to process. Please call our office if you haven't heard anything after this time frame. ? ?Take care, ? ?Vance Peper, NP ? ?

## 2022-03-28 NOTE — Assessment & Plan Note (Signed)
Chronic, ongoing.  She states that she has tried multiple nasal sprays, Zyrtec, Allegra and nothing is helped.  Symptoms gets worse with allergies and pollen, however it is intermittent throughout the year.  We will place referral to ENT for further evaluation. ?

## 2022-04-11 DIAGNOSIS — Z01419 Encounter for gynecological examination (general) (routine) without abnormal findings: Secondary | ICD-10-CM | POA: Diagnosis not present

## 2022-04-11 DIAGNOSIS — Z113 Encounter for screening for infections with a predominantly sexual mode of transmission: Secondary | ICD-10-CM | POA: Diagnosis not present

## 2022-04-11 DIAGNOSIS — Z6821 Body mass index (BMI) 21.0-21.9, adult: Secondary | ICD-10-CM | POA: Diagnosis not present

## 2022-05-04 ENCOUNTER — Telehealth: Payer: Self-pay | Admitting: Nurse Practitioner

## 2022-05-04 DIAGNOSIS — R0981 Nasal congestion: Secondary | ICD-10-CM

## 2022-05-04 NOTE — Telephone Encounter (Signed)
I have attempted to contact this patient by phone, but vm is full. If she cb please refer to phone note. Referral has been placed.

## 2022-05-04 NOTE — Telephone Encounter (Signed)
Pt called about her referral to ENT but I do not see a referral for that. Please advise.

## 2022-10-03 ENCOUNTER — Encounter: Payer: Self-pay | Admitting: Nurse Practitioner

## 2023-04-17 ENCOUNTER — Encounter: Payer: Self-pay | Admitting: Nurse Practitioner

## 2024-01-17 ENCOUNTER — Encounter: Payer: Self-pay | Admitting: Nurse Practitioner

## 2024-06-15 ENCOUNTER — Encounter: Payer: Self-pay | Admitting: Nurse Practitioner

## 2024-07-22 ENCOUNTER — Encounter: Payer: Self-pay | Admitting: Nurse Practitioner
